# Patient Record
Sex: Female | Born: 1965 | Race: White | Hispanic: No | Marital: Married | Smoking: Never smoker
Health system: Southern US, Community
[De-identification: ages and names within clinical notes are randomized; demographics above are authoritative.]

## PROBLEM LIST (undated history)

## (undated) DIAGNOSIS — I10 Essential (primary) hypertension: Secondary | ICD-10-CM

## (undated) DIAGNOSIS — R519 Headache, unspecified: Secondary | ICD-10-CM

## (undated) DIAGNOSIS — R011 Cardiac murmur, unspecified: Secondary | ICD-10-CM

## (undated) DIAGNOSIS — L301 Dyshidrosis [pompholyx]: Secondary | ICD-10-CM

## (undated) DIAGNOSIS — A64 Unspecified sexually transmitted disease: Secondary | ICD-10-CM

## (undated) HISTORY — DX: Essential (primary) hypertension: I10

## (undated) HISTORY — DX: Pure hypercholesterolemia, unspecified: E78.00

## (undated) HISTORY — PX: HERNIA REPAIR: SHX51

## (undated) HISTORY — DX: Dyshidrosis (pompholyx): L30.1

## (undated) HISTORY — DX: Unspecified sexually transmitted disease: A64

## (undated) HISTORY — DX: Cardiac murmur, unspecified: R01.1

## (undated) HISTORY — DX: Headache, unspecified: R51.9

---
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
## 1998-08-17 ENCOUNTER — Other Ambulatory Visit: Admission: RE | Admit: 1998-08-17 | Discharge: 1998-08-17 | Payer: Self-pay | Admitting: Obstetrics and Gynecology

## 1999-08-31 ENCOUNTER — Other Ambulatory Visit: Admission: RE | Admit: 1999-08-31 | Discharge: 1999-08-31 | Payer: Self-pay | Admitting: *Deleted

## 1999-10-26 ENCOUNTER — Other Ambulatory Visit: Admission: RE | Admit: 1999-10-26 | Discharge: 1999-10-26 | Payer: Self-pay | Admitting: Plastic Surgery

## 2000-04-11 HISTORY — PX: CYST EXCISION: SHX5701

## 2000-09-22 ENCOUNTER — Other Ambulatory Visit: Admission: RE | Admit: 2000-09-22 | Discharge: 2000-09-22 | Payer: Self-pay | Admitting: *Deleted

## 2001-10-25 ENCOUNTER — Other Ambulatory Visit: Admission: RE | Admit: 2001-10-25 | Discharge: 2001-10-25 | Payer: Self-pay | Admitting: Obstetrics and Gynecology

## 2002-09-19 ENCOUNTER — Encounter: Payer: Self-pay | Admitting: Obstetrics and Gynecology

## 2002-09-19 ENCOUNTER — Ambulatory Visit (HOSPITAL_COMMUNITY): Admission: RE | Admit: 2002-09-19 | Discharge: 2002-09-19 | Payer: Self-pay | Admitting: Obstetrics and Gynecology

## 2002-11-04 ENCOUNTER — Other Ambulatory Visit: Admission: RE | Admit: 2002-11-04 | Discharge: 2002-11-04 | Payer: Self-pay | Admitting: Obstetrics and Gynecology

## 2004-01-29 ENCOUNTER — Inpatient Hospital Stay (HOSPITAL_COMMUNITY): Admission: AD | Admit: 2004-01-29 | Discharge: 2004-02-01 | Payer: Self-pay | Admitting: Obstetrics and Gynecology

## 2004-03-03 ENCOUNTER — Other Ambulatory Visit: Admission: RE | Admit: 2004-03-03 | Discharge: 2004-03-03 | Payer: Self-pay | Admitting: Obstetrics and Gynecology

## 2005-07-06 ENCOUNTER — Other Ambulatory Visit: Admission: RE | Admit: 2005-07-06 | Discharge: 2005-07-06 | Payer: Self-pay | Admitting: Obstetrics and Gynecology

## 2007-06-28 ENCOUNTER — Ambulatory Visit: Payer: Self-pay | Admitting: Cardiology

## 2007-09-26 ENCOUNTER — Ambulatory Visit (HOSPITAL_COMMUNITY): Admission: RE | Admit: 2007-09-26 | Discharge: 2007-09-26 | Payer: Self-pay | Admitting: Obstetrics and Gynecology

## 2008-09-26 ENCOUNTER — Ambulatory Visit (HOSPITAL_COMMUNITY): Admission: RE | Admit: 2008-09-26 | Discharge: 2008-09-26 | Payer: Self-pay | Admitting: Obstetrics and Gynecology

## 2008-12-27 DIAGNOSIS — R011 Cardiac murmur, unspecified: Secondary | ICD-10-CM | POA: Insufficient documentation

## 2008-12-27 DIAGNOSIS — A389 Scarlet fever, uncomplicated: Secondary | ICD-10-CM | POA: Insufficient documentation

## 2009-09-28 ENCOUNTER — Ambulatory Visit (HOSPITAL_COMMUNITY): Admission: RE | Admit: 2009-09-28 | Discharge: 2009-09-28 | Payer: Self-pay | Admitting: Obstetrics and Gynecology

## 2010-08-24 NOTE — Assessment & Plan Note (Signed)
[facility]                            CARDIOLOGY OFFICE NOTE   PRESSLEY, BARSKY                      MRN:          [id number]  DATE:06/28/2007                            DOB:          10/24/1965    PRIMARY CARE PHYSICIAN:  Stan Head. Cleta Alberts, M.D.   REASON FOR PRESENTATION:  Evaluate patient with questionable rheumatic  fever.   HISTORY OF PRESENT ILLNESS:  The patient is a pleasant 46 year old white  female without cardiac history.  She developed 2 weeks ago throat and  upper respiratory symptoms.  She was subsequently found to have a  positive strep throat culture.  She was initially treated with a Z-Pak.  However, she then developed pain in multiple joints.  She described back  pain, hip pain, pain in her hands and knees.  This was a multiple  polyarticular arthritis.  She is allergic to PENICILLIN.  Her strep was  treated with doxycycline.  She took this for 7 days.  She was noted to  have a negative stress test following this.  She did have an elevated  ASO titer.  She has not had any rash or skin nodules.  She has not had  any chest discomfort, neck or arm discomfort.  She is not reporting any  palpitations, presyncope or syncope.  However, she is still bothered by  the joint discomfort.  She had been off of the antibiotics for a couple  of days.  She was referred for consideration of any cardiac involvement  with rheumatic fever.  The patient does have a history of a murmur since  childhood.  She apparently has had an echo a few years ago, though I do  not have this report at this point.  She did need cardiovascular follow-  up after the echo some years ago.   PAST MEDICAL HISTORY:  The patient has no history of hypertension,  diabetes or hyperlipidemia.   PAST SURGICAL HISTORY:  1. Kidney surgery as a child.  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  There was      also apparently a  partial nephrectomy.  She does not have any of      the details of this).  2. Hernia repairs as a child, bilateral inguinal.   ALLERGIES:  PENICILLIN causes anaphylaxis.  aspirin caused a rash.  BACTRIM caused a rash.  VANCOMYCIN caused apparent red man syndrome.   MEDICATIONS:  Tylenol.   SOCIAL HISTORY:  The patient is a Runner, broadcasting/film/video in high school.  She is  married.  She has one 80-year-old child.  She does not smoke cigarettes  and never has.  She drinks alcohol socially.   FAMILY HISTORY:  Contributory for her father dying of myocardial  infarction at age 54.  She has a brother with pulmonary hypertension.   REVIEW OF SYSTEMS:  Positive for migraines.  Negative for other systems  except as stated in the HPI.   PHYSICAL EXAMINATION:  The patient is in no distress.  Blood pressure 113/79, heart rate 91 and regular,  body mass index 33,  weight 179 pounds.  HEENT:  Eyelids unremarkable.  Pupils equal, round and reactive to  light.  Fundi within normal limits.  Oral mucosa unremarkable.  NECK:  No jugular venous distention at 45 degrees.  Carotid upstroke  brisk and symmetrical.  No bruits, no thyromegaly.  LYMPHATICS:  No cervical, axillary or inguinal adenopathy.  LUNGS:  Clear to auscultation bilaterally.  BACK:  No costovertebral tenderness.  CHEST:  Unremarkable.  HEART:  PMI not displaced or sustained, S1 and S2 within normal limits,  no S3, no clicks, no rubs, no murmurs.  ABDOMEN:  Flat, positive bowel sounds, normal in frequency and pitch, no  bruits, no rebound, no guarding, no midline pulsatile mass, no  hepatomegaly, no splenomegaly.  SKIN:  No rashes, no nodules.  EXTREMITIES:  2+ pulses, no joint swelling or tenderness, no splinter  hemorrhages, no ulcers, nodes or Janeway lesions.  NEUROLOGIC:  Oriented to person, place and time, cranial nerves II-XII  grossly intact, motor grossly intact.   EKG:  Sinus rhythm, rate 91, axis within normal limits, borderline low   voltages in the limb leads, normal intervals, no acute ST-T wave  changes.   ASSESSMENT/PLAN:  1. Rule out rheumatic fever.  The patient clearly had a      poststreptococcal polyarthritis.  She does not have any Jones      criteria, however, for rheumatic heart disease or acute rheumatic      fever.  At this point she has been treated for the streptococcal      infection.  The joint pain should be treated symptomatically.  She      cannot take ASPIRIN, which is the usual therapy.  I have suggested      Motrin at anti-inflammatory doses.  This could be up to 800 mg four      times a day.  She could take this for a week and perhaps taper      down.  At this point there are no EKG findings or physical findings      of carditis.  Therefore, I do not think further cardiovascular      imaging is warranted.  Prophylaxis for recurrent infections is not      indicated in this condition.  Of note, I did call Dr. Darlina Sicilian to      discuss this case while the patient was in the office, and he      confers with the above.  I did tell her that should she have any      symptoms of chest discomfort, rash, continued arthritis or other      findings, she should present back here or to Dr. Cleta Alberts.  Dr. Cleta Alberts      will be following her closely.  2. Follow-up.  I will see her back as needed.     Rollene Rotunda, MD, [facility]  Electronically Signed    JH/MedQ  DD: 06/28/2007  DT: 06/29/2007  Job #: [id number]   cc:   Brett Canales A. Cleta Alberts, M.D.

## 2010-08-27 NOTE — Discharge Summary (Signed)
Ann West, Ann West               ACCOUNT NO.:  [id number]   MEDICAL RECORD NO.:  [id number]          PATIENT TYPE:  INP   LOCATION:  9143                          FACILITY:  [facility]   PHYSICIAN:  Gerrit Friends. Aldona Bar, M.D.   DATE OF BIRTH:  Nov 08, 1965   DATE OF ADMISSION:  01/29/2004  DATE OF DISCHARGE:  02/01/2004                                 DISCHARGE SUMMARY   DISCHARGE DIAGNOSES:  1.  Term pregnancy, delivered 7 pound 15 ounce female infant, Apgars 9 and 9.  2.  Blood type A positive.   PROCEDURES:  1.  Normal spontaneous delivery.  2.  Second-degree tear and repair.  3.  Manual exploration of uterus.  4.  Epidural anesthesia.   SUMMARY:  This 45 year old, primigravida presented at [redacted] weeks gestation  with contractions.  She had a pregnancy that was complicated by advanced  maternal age and had normal first trimester screening and an alpha-  fetoprotein level but no amniocentesis.  She has a history of mitral valve  prolapse.  On echocardiogram, mild tricuspid regurgitation was noted.  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   After admission, she underwent amniotomy - fluid was slightly blood-tinged.  She was put on clindamycin for SBE prophylaxis.  She progressed, did require  Pitocin, eventually had an epidural placed, and slowly continued to  progress.  She had a second stage of labor that lasted slightly over 3 hours  with a subsequent normal spontaneous delivery of a viable female infant  presenting in a compound presentation.  Apgars were 9 and 9.  After  delivery, there was some uterine atony noted, and uterine exploration was  carried out with no production of products of conception noted.  She was  treated with Methergine postpartum.  Her tear was repaired without  difficulty.  On the morning of October 21, she seemed to be stable but was  continued on clindamycin for a total of 24 hours.  On the morning of October  22, her  hemoglobin was noted to be 10.3 with a white count of 18,200, and a  platelet count of 162,000.  On the morning of October 23, she was ambulating  well, tolerating a regular diet well, having normal bowel and bladder  function and was afebrile.  Her breast feeding was going well, and she was  very desirous of discharge.  Accordingly, she was given all appropriate  instructions and understood all instructions well.   DISCHARGE MEDICATIONS:  1.  Vitamins 1 daily.  2.  Slow release iron 1 daily.  3.  She will use Tylenol for discomfort p.r.n.   She was given a discharge brochure at the time of discharge and understood  all instructions well.  Follow up in the office will be arranged by the  patient 4 weeks from now.   CONDITION ON DISCHARGE:  Improved.      RMW/MEDQ  D:  02/01/2004  T:  02/01/2004  Job:  [id number]

## 2010-11-01 ENCOUNTER — Other Ambulatory Visit (HOSPITAL_COMMUNITY): Payer: Self-pay | Admitting: Obstetrics and Gynecology

## 2010-11-01 DIAGNOSIS — Z1231 Encounter for screening mammogram for malignant neoplasm of breast: Secondary | ICD-10-CM

## 2010-11-10 ENCOUNTER — Ambulatory Visit (HOSPITAL_COMMUNITY)
Admission: RE | Admit: 2010-11-10 | Discharge: 2010-11-10 | Disposition: A | Discharge status: Home / Self Care | Payer: BC Managed Care – PPO | Source: Ambulatory Visit | Attending: Obstetrics and Gynecology | Admitting: Obstetrics and Gynecology

## 2010-11-10 DIAGNOSIS — Z1231 Encounter for screening mammogram for malignant neoplasm of breast: Secondary | ICD-10-CM | POA: Insufficient documentation

## 2011-03-21 ENCOUNTER — Ambulatory Visit (INDEPENDENT_AMBULATORY_CARE_PROVIDER_SITE_OTHER): Payer: BC Managed Care – PPO

## 2011-03-21 DIAGNOSIS — J029 Acute pharyngitis, unspecified: Secondary | ICD-10-CM

## 2011-10-21 ENCOUNTER — Other Ambulatory Visit (HOSPITAL_COMMUNITY): Payer: Self-pay | Admitting: Obstetrics and Gynecology

## 2011-10-21 DIAGNOSIS — Z1231 Encounter for screening mammogram for malignant neoplasm of breast: Secondary | ICD-10-CM

## 2011-11-11 ENCOUNTER — Ambulatory Visit (HOSPITAL_COMMUNITY)
Admission: RE | Admit: 2011-11-11 | Discharge: 2011-11-11 | Disposition: A | Discharge status: Home / Self Care | Payer: BC Managed Care – PPO | Source: Ambulatory Visit | Attending: Obstetrics and Gynecology | Admitting: Obstetrics and Gynecology

## 2011-11-11 DIAGNOSIS — Z1231 Encounter for screening mammogram for malignant neoplasm of breast: Secondary | ICD-10-CM

## 2012-01-17 ENCOUNTER — Ambulatory Visit (INDEPENDENT_AMBULATORY_CARE_PROVIDER_SITE_OTHER): Payer: Worker's Compensation | Admitting: Sports Medicine

## 2012-01-17 VITALS — BP 126/86 | Ht 62.0 in | Wt 188.0 lb

## 2012-01-17 DIAGNOSIS — M722 Plantar fascial fibromatosis: Secondary | ICD-10-CM | POA: Insufficient documentation
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
## 2012-02-16 ENCOUNTER — Ambulatory Visit (INDEPENDENT_AMBULATORY_CARE_PROVIDER_SITE_OTHER): Payer: Worker's Compensation | Admitting: Sports Medicine

## 2012-02-16 VITALS — BP 138/88 | Ht 62.0 in | Wt 188.0 lb

## 2012-02-16 DIAGNOSIS — M79673 Pain in unspecified foot: Secondary | ICD-10-CM

## 2012-02-16 DIAGNOSIS — M79609 Pain in unspecified limb: Secondary | ICD-10-CM

## 2012-02-16 NOTE — Progress Notes (Signed)
Patient ID: Ann West, female   DOB: 1965-11-24, 46 y.o.   MRN: [id number]  SUBJECTIVE: Ann West is a 46 y.o. female who presents for follow-up of LEFT heel pain secondary to plantar fasciitis vs calcaneal contusion.  Since last visit she has been doing ice baths prn on days with significant pain, usually 1-2 / week but not daily.  Otherwise, she has been doing achilles eccentric exercises daily, stretching her achilles / PF daily and wearing insoles 100% of time, in all shoes.  Occasionally wearing arch straps but doesn't see significant difference with them on vs off.  Reports that her first few steps in the morning are most painful or after periods of prolonged sitting.  She has primary complaint of achilles tightness when walking.  She is at 90% of her pre-injury level of function and stability.  PMHx: Mild heart murmur since childhood, she reports as benign   PSHx: 2 abdominal hernia surgeries at 48, 46 years old Ureter-bladder attachment @ 46 years old Axillary cyst resection (benign) @ 46 years old  Meds: None  Allergies: ASA PCN Vancomycin Bactrim / TMP-SMX  Social: Works as Barrister's clerk at [employer] Non-smoker Occasional EtOH with dinner  OBJECTIVE: Vital signs as noted above Well-appearing female in no acute distress, resting comfortably  Foot exam: - No gross abnormalities on inspection of bilateral feet - AROM is symmetric and appropriate for ankle dorsiflexion, plantar flexion, inversion and eversion - 5/5 strength in each of the above movements; all pain-free - No tenderness to palpation at plantar fascia insertion, bilateral malleoli, base of 5th and navicular bilaterally - Negative calcaneal squeeze and tap tests bilaterally - Negative Thompson test bilaterally - No appreciable nodule or defects when palpating achilles tendon bilaterally - No pain, discomfort with compression or stretching of plantar aspect of foot or when maximally  stretching plantar fascia  Foot U/S: - Left plantar fascia has 0.53 cm thickness as measured in longitudinal view (prior measurement 0.56 cm); no appreciable calcifications, hypoechoic changes or edema present.  Negative on Doppler   ASSESSMENT: 1. Calcaneal contusion, healing  PLAN: Given historical and sonographic findings she appears to have calcaneal contusion > plantar fasciitis.  Given her profession, and spending 7 or more hours daily on her feet it would not be unusual to have compensatory hypertrophy of bilateral plantar fascia.  While slightly smaller on ultrasound her plantar fascia still appears enlarged -- however, she is essentially pain-free and almost at pre-injury level of function, thus making calcaneal contusion more likely. She will continue wearing temporary orthotics 100% of the time when prolonged standing (at work) or if she chooses to exercise.  She will use ice baths prn and continue stretching achilles prn for comfort and continued function. We will schedule a tentative followup appointment for 4 weeks from now but if patient is completely asymptomatic she may cancel that appointment to followup prn.   -- Kenney Houseman, MS IV

## 2012-03-19 ENCOUNTER — Encounter: Payer: Worker's Compensation | Admitting: Sports Medicine

## 2012-10-30 ENCOUNTER — Other Ambulatory Visit: Payer: Self-pay | Admitting: Obstetrics and Gynecology

## 2012-10-30 DIAGNOSIS — Z1231 Encounter for screening mammogram for malignant neoplasm of breast: Secondary | ICD-10-CM

## 2012-11-19 ENCOUNTER — Other Ambulatory Visit: Payer: Self-pay | Admitting: Obstetrics and Gynecology

## 2012-11-19 ENCOUNTER — Ambulatory Visit (HOSPITAL_COMMUNITY)
Admission: RE | Admit: 2012-11-19 | Discharge: 2012-11-19 | Disposition: A | Discharge status: Home / Self Care | Payer: BC Managed Care – PPO | Source: Ambulatory Visit | Attending: Obstetrics and Gynecology | Admitting: Obstetrics and Gynecology

## 2012-11-19 DIAGNOSIS — R928 Other abnormal and inconclusive findings on diagnostic imaging of breast: Secondary | ICD-10-CM

## 2012-11-19 DIAGNOSIS — Z1231 Encounter for screening mammogram for malignant neoplasm of breast: Secondary | ICD-10-CM | POA: Insufficient documentation

## 2012-11-22 ENCOUNTER — Telehealth: Payer: Self-pay

## 2012-11-22 NOTE — Telephone Encounter (Signed)
Patient notified of abnormal mammogram and advised to call [facility] to schedule additional views of The right breast.

## 2012-11-22 NOTE — Telephone Encounter (Signed)
[facility] to call for test results (mammogram).

## 2012-11-22 NOTE — Telephone Encounter (Signed)
Message copied by Alphonsa Overall on Thu Nov 22, 2012  1:32 PM ------      Message from: Conley Simmonds      Created: Mon Nov 19, 2012  4:23 PM       Please contact the patient to let her know that I received her mammogram report.  ([facility].)            Ann West is a former patient and does not have an appointment scheduled with me yet at our office. ------

## 2012-11-23 ENCOUNTER — Ambulatory Visit
Admission: RE | Admit: 2012-11-23 | Discharge: 2012-11-23 | Disposition: A | Discharge status: Home / Self Care | Payer: BC Managed Care – PPO | Source: Ambulatory Visit | Attending: Obstetrics and Gynecology | Admitting: Obstetrics and Gynecology

## 2012-11-23 ENCOUNTER — Other Ambulatory Visit: Payer: BC Managed Care – PPO

## 2012-11-23 DIAGNOSIS — R928 Other abnormal and inconclusive findings on diagnostic imaging of breast: Secondary | ICD-10-CM

## 2013-06-13 ENCOUNTER — Emergency Department (INDEPENDENT_AMBULATORY_CARE_PROVIDER_SITE_OTHER)
Admission: EM | Admit: 2013-06-13 | Discharge: 2013-06-13 | Disposition: A | Discharge status: Home / Self Care | Payer: Worker's Compensation | Source: Home / Self Care | Attending: Emergency Medicine | Admitting: Emergency Medicine

## 2013-06-13 ENCOUNTER — Emergency Department (INDEPENDENT_AMBULATORY_CARE_PROVIDER_SITE_OTHER): Payer: Worker's Compensation

## 2013-06-13 ENCOUNTER — Encounter (HOSPITAL_COMMUNITY): Payer: Self-pay | Admitting: Emergency Medicine

## 2013-06-13 DIAGNOSIS — S63611A Unspecified sprain of left index finger, initial encounter: Secondary | ICD-10-CM

## 2013-06-13 DIAGNOSIS — S63613A Unspecified sprain of left middle finger, initial encounter: Secondary | ICD-10-CM

## 2013-06-13 DIAGNOSIS — S6390XA Sprain of unspecified part of unspecified wrist and hand, initial encounter: Secondary | ICD-10-CM

## 2013-06-13 NOTE — ED Notes (Signed)
Reports injury to left index and middle finger at work.   Pt states that she tripped and fell and slid into wall jamming fingers.  States left index was displaced and popped it back in place.  Fingers are bruised and swollen, limited ROM.  Incident happened today around 3:15 p.m

## 2013-06-13 NOTE — ED Provider Notes (Signed)
CSN: [id number]     Arrival date & time 06/13/13  1700 History   None    Chief Complaint  Patient presents with  . Hand Injury   (Consider location/radiation/quality/duration/timing/severity/associated sxs/prior Treatment) Patient is a 48 y.o. female presenting with hand injury. The history is provided by the patient.  Hand Injury Location:  Finger Time since incident:  3 hours Injury: yes   Mechanism of injury: fall   Mechanism of injury comment:  Tripped and fell and left middle and index fingers struck wall. Fall:    Fall occurred:  Tripped   Height of fall:  From standing   Impact surface:  Wall Finger location:  L index finger and L middle finger Pain details:    Quality:  Aching and throbbing   Radiates to:  Does not radiate   Severity:  Moderate   Onset quality:  Sudden   Timing:  Constant Chronicity:  New Handedness:  Right-handed Dislocation: yes (patient states she reduced left index finger dislocation PTA)   Foreign body present:  No foreign bodies Prior injury to area:  No   History reviewed. No pertinent past medical history. Past Surgical History  Procedure Laterality Date  . Hernia repair    . Kidney surgery    . Cyst excision     History reviewed. No pertinent family history. History  Substance Use Topics  . Smoking status: Never Smoker   . Smokeless tobacco: Not on file  . Alcohol Use: Yes   OB History   Grav Para Term Preterm Abortions TAB SAB Ect Mult Living                 Review of Systems  All other systems reviewed and are negative.    Allergies  Asa; Bactrim; Penicillins; and Vancomycin  Home Medications  No current outpatient prescriptions on file. BP 156/98  Pulse 94  Temp(Src) 98.7 F (37.1 C) (Oral)  Resp 16  SpO2 100%  LMP 05/23/2013 Physical Exam  Nursing note and vitals reviewed. Constitutional: She is oriented to person, place, and time. She appears well-developed and well-nourished. No distress.  HENT:  Head:  Normocephalic and atraumatic.  Neck: Normal range of motion. Neck supple.  Cardiovascular: Normal rate.   Pulmonary/Chest: Effort normal.  Musculoskeletal:       Hands: Neurological: She is alert and oriented to person, place, and time.  Skin: Skin is warm and dry.  Psychiatric: She has a normal mood and affect. Her behavior is normal.    ED Course  Procedures (including critical care time) Labs Review Labs Reviewed - No data to display Imaging Review Dg Hand Complete Left  06/13/2013   CLINICAL DATA:  Injury, left index finger and middle finger.  EXAM: LEFT HAND - COMPLETE 3+ VIEW  COMPARISON:  None available for comparison at time of study interpretation.  FINDINGS: No acute fracture deformity or dislocation. Joint space intact without erosions. No destructive bony lesions. Second, third and to lesser extent fourth digit soft tissue swelling without subcutaneous gas or radiopaque foreign bodies.  IMPRESSION: No acute fracture deformity or dislocation.   Electronically Signed   By: Awilda Metro   On: 06/13/2013 19:00     MDM   1. Sprain of left index finger   2. Sprain of left middle finger    Left index and middle finger sprain: xrays without bony fracture or dislocation and physical exam without deficit. Will splint injured fingers for comfort and refer to [facility]  for further management as this is a Doctor, hospitalWorkman's Compensation case.   Jess BartersJennifer Lee [place], [place] 06/13/13 [address]  Ravonda Brecheen Lee [place], [place] 06/13/13 [phone]

## 2013-06-13 NOTE — ED Provider Notes (Signed)
Medical screening examination/treatment/procedure(s) were performed by non-physician practitioner and as supervising physician I was immediately available for consultation/collaboration.  Junie Engram, M.D.  Nara Paternoster C Desaray Marschner, MD 06/13/13 [id number] 

## 2013-06-13 NOTE — Discharge Instructions (Signed)
Finger Sprain A finger sprain is a tear in one of the strong, fibrous tissues that connect the bones (ligaments) in your finger. The severity of the sprain depends on how much of the ligament is torn. The tear can be either partial or complete. CAUSES  Often, sprains are a result of a fall or accident. If you extend your hands to catch an object or to protect yourself, the force of the impact causes the fibers of your ligament to stretch too much. This excess tension causes the fibers of your ligament to tear. SYMPTOMS  You may have some loss of motion in your finger. Other symptoms include:  Bruising.  Tenderness.  Swelling. DIAGNOSIS  In order to diagnose finger sprain, your caregiver will physically examine your finger or thumb to determine how torn the ligament is. Your caregiver may also suggest an X-ray exam of your finger to make sure no bones are broken. TREATMENT  If your ligament is only partially torn, treatment usually involves keeping the finger in a fixed position (immobilization) for a short period. To do this, your caregiver will apply a bandage, cast, or splint to keep your finger from moving until it heals. For a partially torn ligament, the healing process usually takes 2 to 3 weeks. If your ligament is completely torn, you may need surgery to reconnect the ligament to the bone. After surgery a cast or splint will be applied and will need to stay on your finger or thumb for 4 to 6 weeks while your ligament heals. HOME CARE INSTRUCTIONS  Keep your injured finger elevated, when possible, to decrease swelling.  To ease pain and swelling, apply ice to your joint twice a day, for 2 to 3 days:  Put ice in a plastic bag.  Place a towel between your skin and the bag.  Leave the ice on for 15 minutes.  Only take over-the-counter or prescription medicine for pain as directed by your caregiver.  Do not wear rings on your injured finger.  Do not leave your finger unprotected  until pain and stiffness go away (usually 3 to 4 weeks).  Do not allow your cast or splint to get wet. Cover your cast or splint with a plastic bag when you shower or bathe. Do not swim.  Your caregiver may suggest special exercises for you to do during your recovery to prevent or limit permanent stiffness. SEEK IMMEDIATE MEDICAL CARE IF:  Your cast or splint becomes damaged.  Your pain becomes worse rather than better. MAKE SURE YOU:  Understand these instructions.  Will watch your condition.  Will get help right away if you are not doing well or get worse. Document Released: 05/05/2004 Document Revised: 06/20/2011 Document Reviewed: 11/29/2010 [facility] Patient Information 2014 [employer], [place].   Your xrays were without evidence of bony injury or dislocation. Please keep splint clean, dry and in place until you are [facility]. They will outline your activities and restrictions along with completing your return to work paperwork. Please bring copy of your chart and xrays with you to follow up visit.

## 2015-10-21 ENCOUNTER — Other Ambulatory Visit: Payer: Self-pay | Admitting: Obstetrics and Gynecology

## 2015-10-21 DIAGNOSIS — Z1231 Encounter for screening mammogram for malignant neoplasm of breast: Secondary | ICD-10-CM

## 2015-10-22 ENCOUNTER — Encounter: Payer: Self-pay | Admitting: Obstetrics and Gynecology

## 2015-10-22 ENCOUNTER — Ambulatory Visit (INDEPENDENT_AMBULATORY_CARE_PROVIDER_SITE_OTHER): Payer: BC Managed Care – PPO | Admitting: Obstetrics and Gynecology

## 2015-10-22 VITALS — BP 128/84 | HR 70 | Resp 20 | Ht 62.0 in | Wt 187.0 lb

## 2015-10-22 DIAGNOSIS — Z01419 Encounter for gynecological examination (general) (routine) without abnormal findings: Secondary | ICD-10-CM

## 2015-10-22 LAB — CBC
HCT: 41.9 % (ref 35.0–45.0)
HEMOGLOBIN: 14.2 g/dL (ref 11.7–15.5)
MCH: 30 pg (ref 27.0–33.0)
MCHC: 33.9 g/dL (ref 32.0–36.0)
MCV: 88.4 fL (ref 80.0–100.0)
MPV: 10.9 fL (ref 7.5–12.5)
Platelets: 245 10*3/uL (ref 140–400)
RBC: 4.74 MIL/uL (ref 3.80–5.10)
RDW: 14.3 % (ref 11.0–15.0)
WBC: 5.4 10*3/uL (ref 3.8–10.8)

## 2015-10-22 LAB — TSH: TSH: 1.34 m[IU]/L

## 2015-10-22 NOTE — Patient Instructions (Signed)
Health Maintenance, Female Adopting a healthy lifestyle and getting preventive care can go a long way to promote health and wellness. Talk with your health care provider about what schedule of regular examinations is right for you. This is a good chance for you to check in with your provider about disease prevention and staying healthy. In between checkups, there are plenty of things you can do on your own. Experts have done a lot of research about which lifestyle changes and preventive measures are most likely to keep you healthy. Ask your health care provider for more information. WEIGHT AND DIET  Eat a healthy diet  Be sure to include plenty of vegetables, fruits, low-fat dairy products, and lean protein.  Do not eat a lot of foods high in solid fats, added sugars, or salt.  Get regular exercise. This is one of the most important things you can do for your health.  Most adults should exercise for at least 150 minutes each week. The exercise should increase your heart rate and make you sweat (moderate-intensity exercise).  Most adults should also do strengthening exercises at least twice a week. This is in addition to the moderate-intensity exercise.  Maintain a healthy weight  Body mass index (BMI) is a measurement that can be used to identify possible weight problems. It estimates body fat based on height and weight. Your health care provider can help determine your BMI and help you achieve or maintain a healthy weight.  For females 20 years of age and older:   A BMI below 18.5 is considered underweight.  A BMI of 18.5 to 24.9 is normal.  A BMI of 25 to 29.9 is considered overweight.  A BMI of 30 and above is considered obese.  Watch levels of cholesterol and blood lipids  You should start having your blood tested for lipids and cholesterol at 48 years of age, then have this test every 5 years.  You may need to have your cholesterol levels checked more often if:  Your lipid  or cholesterol levels are high.  You are older than 48 years of age.  You are at high risk for heart disease.  CANCER SCREENING   Lung Cancer  Lung cancer screening is recommended for adults 55-80 years old who are at high risk for lung cancer because of a history of smoking.  A yearly low-dose CT scan of the lungs is recommended for people who:  Currently smoke.  Have quit within the past 15 years.  Have at least a 30-pack-year history of smoking. A pack year is smoking an average of one pack of cigarettes a day for 1 year.  Yearly screening should continue until it has been 15 years since you quit.  Yearly screening should stop if you develop a health problem that would prevent you from having lung cancer treatment.  Breast Cancer  Practice breast self-awareness. This means understanding how your breasts normally appear and feel.  It also means doing regular breast self-exams. Let your health care provider know about any changes, no matter how small.  If you are in your 20s or 30s, you should have a clinical breast exam (CBE) by a health care provider every 1-3 years as part of a regular health exam.  If you are 40 or older, have a CBE every year. Also consider having a breast X-ray (mammogram) every year.  If you have a family history of breast cancer, talk to your health care provider about genetic screening.  If you   are at high risk for breast cancer, talk to your health care provider about having an MRI and a mammogram every year.  Breast cancer gene (BRCA) assessment is recommended for women who have family members with BRCA-related cancers. BRCA-related cancers include:  Breast.  Ovarian.  Tubal.  Peritoneal cancers.  Results of the assessment will determine the need for genetic counseling and BRCA1 and BRCA2 testing. Cervical Cancer Your health care provider may recommend that you be screened regularly for cancer of the pelvic organs (ovaries, uterus, and  vagina). This screening involves a pelvic examination, including checking for microscopic changes to the surface of your cervix (Pap test). You may be encouraged to have this screening done every 3 years, beginning at age 21.  For women ages 30-65, health care providers may recommend pelvic exams and Pap testing every 3 years, or they may recommend the Pap and pelvic exam, combined with testing for human papilloma virus (HPV), every 5 years. Some types of HPV increase your risk of cervical cancer. Testing for HPV may also be done on women of any age with unclear Pap test results.  Other health care providers may not recommend any screening for nonpregnant women who are considered low risk for pelvic cancer and who do not have symptoms. Ask your health care provider if a screening pelvic exam is right for you.  If you have had past treatment for cervical cancer or a condition that could lead to cancer, you need Pap tests and screening for cancer for at least 20 years after your treatment. If Pap tests have been discontinued, your risk factors (such as having a new sexual partner) need to be reassessed to determine if screening should resume. Some women have medical problems that increase the chance of getting cervical cancer. In these cases, your health care provider may recommend more frequent screening and Pap tests. Colorectal Cancer  This type of cancer can be detected and often prevented.  Routine colorectal cancer screening usually begins at 48 years of age and continues through 48 years of age.  Your health care provider may recommend screening at an earlier age if you have risk factors for colon cancer.  Your health care provider may also recommend using home test kits to check for hidden blood in the stool.  A small camera at the end of a tube can be used to examine your colon directly (sigmoidoscopy or colonoscopy). This is done to check for the earliest forms of colorectal  cancer.  Routine screening usually begins at age 50.  Direct examination of the colon should be repeated every 5-10 years through 48 years of age. However, you may need to be screened more often if early forms of precancerous polyps or small growths are found. Skin Cancer  Check your skin from head to toe regularly.  Tell your health care provider about any new moles or changes in moles, especially if there is a change in a mole's shape or color.  Also tell your health care provider if you have a mole that is larger than the size of a pencil eraser.  Always use sunscreen. Apply sunscreen liberally and repeatedly throughout the day.  Protect yourself by wearing long sleeves, pants, a wide-brimmed hat, and sunglasses whenever you are outside. HEART DISEASE, DIABETES, AND HIGH BLOOD PRESSURE   High blood pressure causes heart disease and increases the risk of stroke. High blood pressure is more likely to develop in:  People who have blood pressure in the high end   of the normal range (130-139/85-89 mm Hg).  People who are overweight or obese.  People who are African American.  If you are 38-23 years of age, have your blood pressure checked every 3-5 years. If you are 61 years of age or older, have your blood pressure checked every year. You should have your blood pressure measured twice--once when you are at a hospital or clinic, and once when you are not at a hospital or clinic. Record the average of the two measurements. To check your blood pressure when you are not at a hospital or clinic, you can use:  An automated blood pressure machine at a pharmacy.  A home blood pressure monitor.  If you are between 45 years and 39 years old, ask your health care provider if you should take aspirin to prevent strokes.  Have regular diabetes screenings. This involves taking a blood sample to check your fasting blood sugar level.  If you are at a normal weight and have a low risk for diabetes,  have this test once every three years after 48 years of age.  If you are overweight and have a high risk for diabetes, consider being tested at a younger age or more often. PREVENTING INFECTION  Hepatitis B  If you have a higher risk for hepatitis B, you should be screened for this virus. You are considered at high risk for hepatitis B if:  You were born in a country where hepatitis B is common. Ask your health care provider which countries are considered high risk.  Your parents were born in a high-risk country, and you have not been immunized against hepatitis B (hepatitis B vaccine).  You have HIV or AIDS.  You use needles to inject street drugs.  You live with someone who has hepatitis B.  You have had sex with someone who has hepatitis B.  You get hemodialysis treatment.  You take certain medicines for conditions, including cancer, organ transplantation, and autoimmune conditions. Hepatitis C  Blood testing is recommended for:  Everyone born from [birth year] through 1963.  Anyone with known risk factors for hepatitis C. Sexually transmitted infections (STIs)  You should be screened for sexually transmitted infections (STIs) including gonorrhea and chlamydia if:  You are sexually active and are younger than 48 years of age.  You are older than 48 years of age and your health care provider tells you that you are at risk for this type of infection.  Your sexual activity has changed since you were last screened and you are at an increased risk for chlamydia or gonorrhea. Ask your health care provider if you are at risk.  If you do not have HIV, but are at risk, it may be recommended that you take a prescription medicine daily to prevent HIV infection. This is called pre-exposure prophylaxis (PrEP). You are considered at risk if:  You are sexually active and do not regularly use condoms or know the HIV status of your partner(s).  You take drugs by injection.  You are sexually  active with a partner who has HIV. Talk with your health care provider about whether you are at high risk of being infected with HIV. If you choose to begin PrEP, you should first be tested for HIV. You should then be tested every 3 months for as long as you are taking PrEP.  PREGNANCY   If you are premenopausal and you may become pregnant, ask your health care provider about preconception counseling.  If you may  become pregnant, take 400 to 800 micrograms (mcg) of folic acid every day.  If you want to prevent pregnancy, talk to your health care provider about birth control (contraception). OSTEOPOROSIS AND MENOPAUSE   Osteoporosis is a disease in which the bones lose minerals and strength with aging. This can result in serious bone fractures. Your risk for osteoporosis can be identified using a bone density scan.  If you are 61 years of age or older, or if you are at risk for osteoporosis and fractures, ask your health care provider if you should be screened.  Ask your health care provider whether you should take a calcium or vitamin D supplement to lower your risk for osteoporosis.  Menopause may have certain physical symptoms and risks.  Hormone replacement therapy may reduce some of these symptoms and risks. Talk to your health care provider about whether hormone replacement therapy is right for you.  HOME CARE INSTRUCTIONS   Schedule regular health, dental, and eye exams.  Stay current with your immunizations.   Do not use any tobacco products including cigarettes, chewing tobacco, or electronic cigarettes.  If you are pregnant, do not drink alcohol.  If you are breastfeeding, limit how much and how often you drink alcohol.  Limit alcohol intake to no more than 1 drink per day for nonpregnant women. One drink equals 12 ounces of beer, 5 ounces of wine, or 1 ounces of hard liquor.  Do not use street drugs.  Do not share needles.  Ask your health care provider for help if  you need support or information about quitting drugs.  Tell your health care provider if you often feel depressed.  Tell your health care provider if you have ever been abused or do not feel safe at home.   This information is not intended to replace advice given to you by your health care provider. Make sure you discuss any questions you have with your health care provider.   Document Released: 10/11/2010 Document Revised: 04/18/2014 Document Reviewed: 02/27/2013 Elsevier Interactive Patient Education [employer].

## 2015-10-22 NOTE — Progress Notes (Signed)
Patient ID: Ann West, female   DOB: Mar 27, 1966, 50 y.o.   MRN: [id number] 50 y.o.  15P1 [name] here for annual exam.   Returning patient to my care.   No menstrual problems.   Wants lab work today.   Meals are almost vegetarian.   Son is now 411 yo.   PCP:   Lesle ChrisSteven Daub, MD  Patient's last menstrual period was 10/11/2015 (exact date).     Period Cycle (Days): 30 Period Duration (Days): 7-8 Period Pattern: Regular Menstrual Flow: Moderate Menstrual Control: Tampon Menstrual Control Change Freq (Hours): every 3-4 hrs on heaviest day Dysmenorrhea: None     Sexually active: Yes.   female The current method of family planning is none.   Withdrawal.   Considering vasectomy.  Exercising: No.   Smoker:  no  Health Maintenance: Pap:  2013 normal per patient History of abnormal Pap:  no MMG:  11-19-12 possible mass Rt.Breast;Lt.Br.Neg--Diag.Rt.mmg 11-23-12 Density B/Neg/BiRads1/screening 2862yr:[facility].  Patient has appt. 10-26-15. Colonoscopy:  n/a BMD:   n/a  Result  n/a TDaP:  PCP Gardasil:   N/A HIV: neg in pregnancy.  Hep C: NA Screening Labs:  Hb today: 14.1, Urine today: Trace RBCs.  Asymptomatic.   reports that she has never smoked. She does not have any smokeless tobacco history on file. She reports that she drinks about 1.8 - 3.0 oz of alcohol per week. She reports that she does not use illicit drugs.  Past Medical History  Diagnosis Date  . STD (sexually transmitted disease) 1988    Tx'd for Chlamydia  . Heart murmur     had been evaluated by cardiologist--no premeds needed for surgery/procedures    Past Surgical History  Procedure Laterality Date  . Hernia repair    . 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  . Cyst excision      benign cyst removed from Rt. underarm    Current Outpatient Prescriptions  Medication Sig Dispense Refill  . ibuprofen (ADVIL,MOTRIN) 200 MG tablet Take 200 mg by mouth every 6  (six) hours as needed for headache (Takes 2-4 tablets prn headache).     No current facility-administered medications for this visit.    Family History  Problem Relation Age of Onset  . Heart attack Father     Dec age 48  . Hypertension Father   . Hyperlipidemia Father   . Hypertension Mother   . Thyroid disease Mother   . Hypertension Maternal Grandmother   . Hypertension Maternal Grandfather   . Hypertension Paternal Grandmother   . Hypertension Paternal Grandfather     ROS:  Pertinent items are noted in HPI.  Otherwise, a comprehensive ROS was negative.  Exam:   BP 128/84 mmHg  Pulse 70  Resp 20  Ht 5\' 2"  (1.575 m)  Wt 187 lb (84.823 kg)  BMI 34.19 kg/m2  LMP 10/11/2015 (Exact Date)    General appearance: alert, cooperative and appears stated age Head: Normocephalic, without obvious abnormality, atraumatic Neck: no adenopathy, supple, symmetrical, trachea midline and thyroid normal to inspection and palpation Lungs: clear to auscultation bilaterally Breasts: normal appearance, no masses or tenderness, Inspection negative, No nipple retraction or dimpling, No nipple discharge or bleeding, No axillary or supraclavicular adenopathy, scar under right axilla. Heart: regular rate and rhythm Abdomen: incisions:  Yes.    Suprapubic Pfannensteil like incision , soft, non-tender; no masses, no organomegaly Extremities: extremities normal, atraumatic, no cyanosis or edema Skin:  Skin color, texture, turgor normal. No rashes or lesions Lymph nodes: Cervical, supraclavicular, and axillary nodes normal. No abnormal inguinal nodes palpated Neurologic: Grossly normal  Pelvic: External genitalia:  no lesions              Urethra:  normal appearing urethra with no masses, tenderness or lesions              Bartholins and Skenes: normal                 Vagina: normal appearing vagina with normal color and discharge, no lesions              Cervix: no lesions              Pap taken: Yes.    Bimanual Exam:  Uterus:  normal size, contour, position, consistency, mobility, non-tender              Adnexa: normal adnexa and no mass, fullness, tenderness              Rectal exam: Yes.  .  Confirms.              Anus:  normal sphincter tone, no lesions  Chaperone was present for exam.  Assessment:   Well woman visit with normal exam. FH CAD.  Plan: Yearly mammogram recommended after age 24.   Has appointment. Pap and HR HPV as above. Discussed Calcium, Vitamin D, regular exercise program including cardiovascular and weight bearing exercise. Labs performed.  Yes.  .   See orders.  Routine labs. Prescription medication(s) given.  No..  Take PNV daily. Follow up annually and prn.     After visit summary provided.

## 2015-10-23 ENCOUNTER — Telehealth: Payer: Self-pay

## 2015-10-23 LAB — COMPREHENSIVE METABOLIC PANEL
ALBUMIN: 4.4 g/dL (ref 3.6–5.1)
ALT: 13 U/L (ref 6–29)
AST: 16 U/L (ref 10–35)
Alkaline Phosphatase: 39 U/L (ref 33–115)
BUN: 12 mg/dL (ref 7–25)
CO2: 22 mmol/L (ref 20–31)
Calcium: 9 mg/dL (ref 8.6–10.2)
Chloride: 106 mmol/L (ref 98–110)
Creat: 0.91 mg/dL (ref 0.50–1.10)
Glucose, Bld: 96 mg/dL (ref 65–99)
Potassium: 4.2 mmol/L (ref 3.5–5.3)
SODIUM: 138 mmol/L (ref 135–146)
TOTAL PROTEIN: 6.9 g/dL (ref 6.1–8.1)
Total Bilirubin: 0.5 mg/dL (ref 0.2–1.2)

## 2015-10-23 LAB — LIPID PANEL
Cholesterol: 187 mg/dL (ref 125–200)
HDL: 71 mg/dL (ref >=46)
LDL CALC: 98 mg/dL (ref <130)
TRIGLYCERIDES: 89 mg/dL (ref <150)
Total CHOL/HDL Ratio: 2.6 Ratio (ref <=5.0)
VLDL: 18 mg/dL (ref <30)

## 2015-10-23 LAB — VITAMIN D 25 HYDROXY (VIT D DEFICIENCY, FRACTURES): VIT D 25 HYDROXY: 30 ng/mL (ref 30–100)

## 2015-10-23 NOTE — Telephone Encounter (Signed)
Spoke with patient. Advised of message as seen below from Dr.Silva. She is agreeable and verbalizes understanding.  Routing to provider for final review. Patient agreeable to disposition. Will close encounter.  

## 2015-10-23 NOTE — Telephone Encounter (Signed)
-----   Message from Brook E AmundsPatton Salleson C Silva, MD sent at 10/23/2015  5:48 AM EDT ----- Please inform patient of her normal blood work - cholesterol, CMP, CBC, TSH, vit D.  Pap is pending.   Cc- Claudette LawsAmanda Dixon

## 2015-10-26 ENCOUNTER — Ambulatory Visit
Admission: RE | Admit: 2015-10-26 | Discharge: 2015-10-26 | Disposition: A | Discharge status: Home / Self Care | Payer: BC Managed Care – PPO | Source: Ambulatory Visit | Attending: Obstetrics and Gynecology | Admitting: Obstetrics and Gynecology

## 2015-10-26 DIAGNOSIS — Z1231 Encounter for screening mammogram for malignant neoplasm of breast: Secondary | ICD-10-CM

## 2015-10-26 LAB — IPS PAP TEST WITH HPV

## 2015-10-29 ENCOUNTER — Other Ambulatory Visit: Payer: Self-pay | Admitting: Obstetrics and Gynecology

## 2015-10-29 DIAGNOSIS — R928 Other abnormal and inconclusive findings on diagnostic imaging of breast: Secondary | ICD-10-CM

## 2015-11-03 ENCOUNTER — Ambulatory Visit
Admission: RE | Admit: 2015-11-03 | Discharge: 2015-11-03 | Disposition: A | Discharge status: Home / Self Care | Payer: BC Managed Care – PPO | Source: Ambulatory Visit | Attending: Obstetrics and Gynecology | Admitting: Obstetrics and Gynecology

## 2015-11-03 DIAGNOSIS — R928 Other abnormal and inconclusive findings on diagnostic imaging of breast: Secondary | ICD-10-CM

## 2015-11-21 ENCOUNTER — Ambulatory Visit (INDEPENDENT_AMBULATORY_CARE_PROVIDER_SITE_OTHER): Payer: BC Managed Care – PPO

## 2015-11-21 ENCOUNTER — Encounter: Payer: Self-pay | Admitting: Physician Assistant

## 2015-11-21 ENCOUNTER — Ambulatory Visit (INDEPENDENT_AMBULATORY_CARE_PROVIDER_SITE_OTHER): Payer: BC Managed Care – PPO | Admitting: Physician Assistant

## 2015-11-21 VITALS — BP 142/80 | HR 69 | Temp 98.5°F | Resp 16 | Ht 62.0 in | Wt 189.0 lb

## 2015-11-21 DIAGNOSIS — M25571 Pain in right ankle and joints of right foot: Secondary | ICD-10-CM

## 2015-11-21 NOTE — Patient Instructions (Signed)
     IF you received an x-ray today, you will receive an invoice from [facility]. Please contact [facility] at [phone] with questions or concerns regarding your invoice.   IF you received labwork today, you will receive an invoice from [facility]. Please contact [facility] at [phone] with questions or concerns regarding your invoice.   Our billing staff will not be able to assist you with questions regarding bills from these companies.  You will be contacted with the lab results as soon as they are available. The fastest way to get your results is to activate your My Chart account. Instructions are located on the last page of this paperwork. If you have not heard from us regarding the results in 2 weeks, please contact this office.      

## 2015-11-21 NOTE — Progress Notes (Signed)
11/21/2015 1:41 PM   DOB: 04-Jun-1965 / MRN: [id number]  SUBJECTIVE:  Ann West is a 50 y.o. female presenting for a fall in which she hit her right medial malleolus pain after striking the area on a hard surface during a hard surface.  The fall occurred two days ago and she has noticed some swelling and sharp constant pain about the area and made worse with ambulation.     She is allergic to asa [aspirin]; bactrim [sulfamethoxazole-trimethoprim]; penicillins; and vancomycin.   She  has a past medical history of Heart murmur and STD (sexually transmitted disease) (1988).    She  reports that she has never smoked. She does not have any smokeless tobacco history on file. She reports that she drinks about 1.8 - 3.0 oz of alcohol per week . She reports that she does not use drugs. She  reports that she currently engages in sexual activity and has had female partners. She reports using the following method of birth control/protection: None. The patient  has a past surgical history that includes Hernia repair; Kidney surgery; and Cyst excision.  Her family history includes Heart attack in her father; Hyperlipidemia in her father; Hypertension in her father, maternal grandfather, maternal grandmother, mother, paternal grandfather, and paternal grandmother; Thyroid disease in her mother.  Review of Systems  Constitutional: Negative for chills and fever.  Cardiovascular: Negative for chest pain.  Musculoskeletal: Positive for joint pain. Negative for falls and myalgias.  Skin: Negative for itching and rash.  Neurological: Negative for dizziness and headaches.  Psychiatric/Behavioral: Negative for depression and suicidal ideas.    The problem list and medications were reviewed and updated by myself where necessary and exist elsewhere in the encounter.   OBJECTIVE:  BP (!) 142/80   Pulse 69   Temp 98.5 F (36.9 C) (Oral)   Resp 16   Ht  (1.575 m)   Wt 189 lb (85.7 kg)   LMP 10/11/2015  (Exact Date)   SpO2 97%   BMI 34.57 kg/m   BP Readings from Last 3 Encounters:  11/21/15 (!) 142/80  10/22/15 128/84  06/13/13 156/98     Physical Exam  Constitutional: She is oriented to person, place, and time. She appears well-developed and well-nourished. No distress.  Cardiovascular: Normal rate and regular rhythm.   Pulmonary/Chest: Effort normal and breath sounds normal.  Musculoskeletal: Normal range of motion.  Mild tenderness about right medial malleolus.  Negative for ROM and strength deficit.   Neurological: She is alert and oriented to person, place, and time. No cranial nerve deficit.  Skin: She is not diaphoretic.    No results found for this or any previous visit (from the past 72 hour(s)).  Dg Ankle Complete Right  Result Date: 11/21/2015 CLINICAL DATA:  Slipped and fell on LEFT lower 2 nights ago, medial pain EXAM: RIGHT ANKLE - COMPLETE 3+ VIEW COMPARISON:  None FINDINGS: Osseous mineralization normal. Joint spaces preserved. No fracture, dislocation, or bone destruction. Minimal dorsal soft tissue swelling at midfoot. IMPRESSION: No acute osseous abnormalities. Electronically Signed   By: Ulyses Southward M.D.   On: 11/21/2015 11:13    ASSESSMENT AND PLAN  Ceniya was seen today for ankle pain.  Diagnoses and all orders for this visit:  Ankle pain, right: Negative for fracture. Advised RICE, OTC ibuprofen and/or Tylenol. RTC as needed.  -     DG Ankle Complete Right; Future    The patient is advised to call or return to clinic  if she does not see an improvement in symptoms, or to seek the care of the closest emergency department if she worsens with the above plan.   Deliah BostonMichael Clark, MHS, PA-C [facility] 11/21/2015 1:41 PM

## 2016-10-28 ENCOUNTER — Ambulatory Visit (INDEPENDENT_AMBULATORY_CARE_PROVIDER_SITE_OTHER): Payer: BC Managed Care – PPO | Admitting: Obstetrics and Gynecology

## 2016-10-28 ENCOUNTER — Other Ambulatory Visit: Payer: Self-pay | Admitting: Obstetrics and Gynecology

## 2016-10-28 ENCOUNTER — Encounter: Payer: Self-pay | Admitting: Obstetrics and Gynecology

## 2016-10-28 VITALS — BP 138/86 | HR 76 | Resp 16 | Ht 61.75 in | Wt 192.0 lb

## 2016-10-28 DIAGNOSIS — R21 Rash and other nonspecific skin eruption: Secondary | ICD-10-CM | POA: Diagnosis not present

## 2016-10-28 DIAGNOSIS — Z01419 Encounter for gynecological examination (general) (routine) without abnormal findings: Secondary | ICD-10-CM | POA: Diagnosis not present

## 2016-10-28 NOTE — Progress Notes (Signed)
51 y.o. 121P1001 [name] here for annual exam.    Periods are regular.  No bladder or bowel concerns.   Seen at the [facility] yesterday for sore throat and had a negative test for strep.  Has periodic rash on her hands like small blisters.  Does not know where it is coming from.  No dermatologist.   Wants general labs.  Just did a sailing trip.   PCP: Ann West   Patient's last menstrual period was 10/11/2016.           Sexually active: Yes.    The current method of family planning is none.    Exercising: Yes.    walking.  Starting a new exercise plan. Smoker:  no  Health Maintenance: Pap:  10/22/15 Neg. HR HPV:neg  History of abnormal Pap:  no MMG:  11/03/15 Diagnostic Left. BIRADS1:neg.  She will schedule 3D at [facility]. Colonoscopy: Never BMD: Never TDaP: Current with PCP HIV: Neg with pregnancy.  Hep C: N/A Screening Labs: here    reports that she has never smoked. She has never used smokeless tobacco. She reports that she drinks about 1.8 - 3.0 oz of alcohol per week . She reports that she does not use drugs.  Past Medical History:  Diagnosis Date  . Heart murmur    had been evaluated by cardiologist--no premeds needed for surgery/procedures  . STD (sexually transmitted disease) 1988   Tx'd for Chlamydia    Past Surgical History:  Procedure Laterality Date  . CYST EXCISION     benign cyst removed from Rt. underarm  . HERNIA REPAIR    . 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    Current Outpatient Prescriptions  Medication Sig Dispense Refill  . Calcium Carbonate-Vit D-Min (CALCIUM 1200 PO) Take by mouth daily.    Marland Kitchen. ibuprofen (ADVIL,MOTRIN) 200 MG tablet Take 200 mg by mouth every 6 (six) hours as needed for headache (Takes 2-4 tablets prn headache).     No current facility-administered medications for this visit.     Family History  Problem Relation Age of Onset  . Heart attack Father        Dec  age 49  . Hypertension Father   . Hyperlipidemia Father   . Hypertension Mother   . Thyroid disease Mother   . Hypertension Maternal Grandmother   . Hypertension Maternal Grandfather   . Hypertension Paternal Grandmother   . Hypertension Paternal Grandfather     ROS:  Pertinent items are noted in HPI.  Otherwise, a comprehensive ROS was negative.  Exam:   BP 138/86 (BP Location: Right Arm, Patient Position: Sitting, Cuff Size: Large)   Pulse 76   Resp 16   Ht 5' 1.75" (1.568 m)   Wt 192 lb (87.1 kg)   LMP 10/11/2016   BMI 35.40 kg/m     General appearance: alert, cooperative and appears stated age Head: Normocephalic, without obvious abnormality, atraumatic Neck: no adenopathy, supple, symmetrical, trachea midline and thyroid normal to inspection and palpation Lungs: clear to auscultation bilaterally Breasts: normal appearance, no masses or tenderness, No nipple retraction or dimpling, No nipple discharge or bleeding, No axillary or supraclavicular adenopathy Heart: regular rate and rhythm Abdomen: soft, non-tender; no masses, no organomegaly Extremities: extremities normal, atraumatic, no cyanosis or edema Skin: Skin color, texture, turgor normal. No rashes or lesions Lymph nodes: Cervical, supraclavicular, and axillary nodes normal. No abnormal inguinal nodes palpated Neurologic: Grossly normal  Pelvic:  External genitalia:  no lesions              Urethra:  normal appearing urethra with no masses, tenderness or lesions              Bartholins and Skenes: normal                 Vagina: normal appearing vagina with normal color and discharge, no lesions.  First degree rectocele.              Cervix: no lesions              Pap taken: No. Bimanual Exam:  Uterus:  normal size, contour, position, consistency, mobility, non-tender              Adnexa: no mass, fullness, tenderness              Rectal exam: Yes.  .  Confirms.              Anus:  normal sphincter tone, no  lesions  Chaperone was present for exam.  Assessment:   Well woman visit with normal exam. Elevated BP. Rash.  Periodic. Rectocele. Asymptomatic.   Plan: Mammogram screening discussed. Recommended self breast awareness. Pap and HR HPV as above. Guidelines for Calcium, Vitamin D, regular exercise program including cardiovascular and weight bearing exercise. Will do routine labs. Referral to dermatology.  She will start monitoring her BP at home and will see her PCP if remains elevated. Discussed new colonoscopy guidelines. Discussed rectocele.   Follow up annually and prn.    After visit summary provided.

## 2016-10-28 NOTE — Patient Instructions (Addendum)
EXERCISE AND DIET:  We recommended that you start or continue a regular exercise program for good health. Regular exercise means any activity that makes your heart beat faster and makes you sweat.  We recommend exercising at least 30 minutes per day at least 3 days a week, preferably 4 or 5.  We also recommend a diet low in fat and sugar.  Inactivity, poor dietary choices and obesity can cause diabetes, heart attack, stroke, and kidney damage, among others.    ALCOHOL AND SMOKING:  Women should limit their alcohol intake to no more than 7 drinks/beers/glasses of wine (combined, not each!) per week. Moderation of alcohol intake to this level decreases your risk of breast cancer and liver damage. And of course, no recreational drugs are part of a healthy lifestyle.  And absolutely no smoking or even second hand smoke. Most people know smoking can cause heart and lung diseases, but did you know it also contributes to weakening of your bones? Aging of your skin?  Yellowing of your teeth and nails?  CALCIUM AND VITAMIN D:  Adequate intake of calcium and Vitamin D are recommended.  The recommendations for exact amounts of these supplements seem to change often, but generally speaking 600 mg of calcium (either carbonate or citrate) and 800 units of Vitamin D per day seems prudent. Certain women may benefit from higher intake of Vitamin D.  If you are among these women, your doctor will have told you during your visit.    PAP SMEARS:  Pap smears, to check for cervical cancer or precancers,  have traditionally been done yearly, although recent scientific advances have shown that most women can have pap smears less often.  However, every woman still should have a physical exam from her gynecologist every year. It will include a breast check, inspection of the vulva and vagina to check for abnormal growths or skin changes, a visual exam of the cervix, and then an exam to evaluate the size and shape of the uterus and  ovaries.  And after 49 years of age, a rectal exam is indicated to check for rectal cancers. We will also provide age appropriate advice regarding health maintenance, like when you should have certain vaccines, screening for sexually transmitted diseases, bone density testing, colonoscopy, mammograms, etc.   MAMMOGRAMS:  All women over 40 years old should have a yearly mammogram. Many facilities now offer a "3D" mammogram, which may cost around $50 extra out of pocket. If possible,  we recommend you accept the option to have the 3D mammogram performed.  It both reduces the number of women who will be called back for extra views which then turn out to be normal, and it is better than the routine mammogram at detecting truly abnormal areas.    COLONOSCOPY:  Colonoscopy to screen for colon cancer is recommended for all women at age 50.  We know, you hate the idea of the prep.  We agree, BUT, having colon cancer and not knowing it is worse!!  Colon cancer so often starts as a polyp that can be seen and removed at colonscopy, which can quite literally save your life!  And if your first colonoscopy is normal and you have no family history of colon cancer, most women don't have to have it again for 10 years.  Once every ten years, you can do something that may end up saving your life, right?  We will be happy to help you get it scheduled when you are ready.    Be sure to check your insurance coverage so you understand how much it will cost.  It may be covered as a preventative service at no cost, but you should check your particular policy.      Pelvic Organ Prolapse Pelvic organ prolapse is the stretching, bulging, or dropping of pelvic organs into an abnormal position. It happens when the muscles and tissues that surround and support pelvic structures are stretched or weak. Pelvic organ prolapse can involve:  Vagina (vaginal prolapse).  Uterus (uterine prolapse).  Bladder (cystocele).  Rectum  (rectocele).  Intestines (enterocele).  When organs other than the vagina are involved, they often bulge into the vagina or protrude from the vagina, depending on how severe the prolapse is. What are the causes? Causes of this condition include:  Pregnancy, labor, and childbirth.  Long-lasting (chronic) cough.  Chronic constipation.  Obesity.  Past pelvic surgery.  Aging. During and after menopause, a decreased production of the hormone estrogen can weaken pelvic ligaments and muscles.  Consistently lifting more than 50 lb (23 kg).  Buildup of fluid in the abdomen due to certain diseases and other conditions.  What are the signs or symptoms? Symptoms of this condition include:  Loss of bladder control when you cough, sneeze, strain, and exercise (stress incontinence). This may be worse immediately following childbirth, and it may gradually improve over time.  Feeling pressure in your pelvis or vagina. This pressure may increase when you cough or when you are having a bowel movement.  A bulge that protrudes from the opening of your vagina or against your vaginal wall. If your uterus protrudes through the opening of your vagina and rubs against your clothing, you may also experience soreness, ulcers, infection, pain, and bleeding.  Increased effort to have a bowel movement or urinate.  Pain in your low back.  Pain, discomfort, or disinterest in sexual intercourse.  Repeated bladder infections (urinary tract infections).  Difficulty inserting or inability to insert a tampon or applicator.  In some people, this condition does not cause any symptoms. How is this diagnosed? Your health care provider may perform an internal and external vaginal and rectal exam. During the exam, you may be asked to cough and strain while you are lying down, sitting, and standing up. Your health care provider will determine if other tests are required, such as bladder function tests. How is this  treated? In most cases, this condition needs to be treated only if it produces symptoms. No treatment is guaranteed to correct the prolapse or relieve the symptoms completely. Treatment may include:  Lifestyle changes, such as: ? Avoiding drinking beverages that contain caffeine. ? Increasing your intake of high-fiber foods. This can help to decrease constipation and straining during bowel movements. ? Emptying your bladder at scheduled times (bladder training therapy). This can help to reduce or avoid urinary incontinence. ? Losing weight if you are overweight or obese.  Estrogen. Estrogen may help mild prolapse by increasing the strength and tone of pelvic floor muscles.  Kegel exercises. These may help mild cases of prolapse by strengthening and tightening the muscles of the pelvic floor.  Pessary insertion. A pessary is a soft, flexible device that is placed into your vagina by your health care provider to help support the vaginal walls and keep pelvic organs in place.  Surgery. This is often the only form of treatment for severe prolapse. Different types of surgeries are available.  Follow these instructions at home:  Wear a sanitary pad or absorbent   product if you have urinary incontinence.  Avoid heavy lifting and straining with exercise and work. Do not hold your breath when you perform mild to moderate lifting and exercise activities. Limit your activities as directed by your health care provider.  Take medicines only as directed by your health care provider.  Perform Kegel exercises as directed by your health care provider.  If you have a pessary, take care of it as directed by your health care provider. Contact a health care provider if:  Your symptoms interfere with your daily activities or sex life.  You need medicine to help with the discomfort.  You notice bleeding from the vagina that is not related to your period.  You have a fever.  You have pain or bleeding  when you urinate.  You have bleeding when you have a bowel movement.  You lose urine when you have sex.  You have chronic constipation.  You have a pessary that falls out.  You have vaginal discharge that has a bad smell.  You have low abdominal pain or cramping that is unusual for you. This information is not intended to replace advice given to you by your health care provider. Make sure you discuss any questions you have with your health care provider. Document Released: 10/23/2013 Document Revised: 09/03/2015 Document Reviewed: 06/10/2013 Elsevier Interactive Patient Education  2018 Elsevier Inc.  

## 2016-10-29 LAB — COMPREHENSIVE METABOLIC PANEL
ALBUMIN: 4.2 g/dL (ref 3.5–5.5)
ALK PHOS: 40 IU/L (ref 39–117)
ALT: 9 IU/L (ref 0–32)
AST: 16 IU/L (ref 0–40)
Albumin/Globulin Ratio: 1.6 (ref 1.2–2.2)
BUN/Creatinine Ratio: 15 (ref 9–23)
BUN: 13 mg/dL (ref 6–24)
Bilirubin Total: 0.3 mg/dL (ref 0.0–1.2)
CO2: 22 mmol/L (ref 20–29)
CREATININE: 0.86 mg/dL (ref 0.57–1.00)
Calcium: 9.4 mg/dL (ref 8.7–10.2)
Chloride: 103 mmol/L (ref 96–106)
GFR, EST AFRICAN AMERICAN: 92 mL/min/{1.73_m2} (ref >59)
GFR, EST NON AFRICAN AMERICAN: 80 mL/min/{1.73_m2} (ref >59)
GLOBULIN, TOTAL: 2.7 g/dL (ref 1.5–4.5)
GLUCOSE: 82 mg/dL (ref 65–99)
Potassium: 4.7 mmol/L (ref 3.5–5.2)
SODIUM: 141 mmol/L (ref 134–144)
TOTAL PROTEIN: 6.9 g/dL (ref 6.0–8.5)

## 2016-10-29 LAB — CBC
HEMATOCRIT: 40.2 % (ref 34.0–46.6)
Hemoglobin: 13.5 g/dL (ref 11.1–15.9)
MCH: 30.1 pg (ref 26.6–33.0)
MCHC: 33.6 g/dL (ref 31.5–35.7)
MCV: 90 fL (ref 79–97)
PLATELETS: 230 10*3/uL (ref 150–379)
RBC: 4.48 x10E6/uL (ref 3.77–5.28)
RDW: 13.7 % (ref 12.3–15.4)
WBC: 5.3 10*3/uL (ref 3.4–10.8)

## 2016-10-29 LAB — LIPID PANEL
CHOL/HDL RATIO: 3.1 ratio (ref 0.0–4.4)
Cholesterol, Total: 154 mg/dL (ref 100–199)
HDL: 50 mg/dL (ref >39)
LDL Calculated: 77 mg/dL (ref 0–99)
Triglycerides: 134 mg/dL (ref 0–149)
VLDL Cholesterol Cal: 27 mg/dL (ref 5–40)

## 2016-10-29 LAB — VITAMIN D 25 HYDROXY (VIT D DEFICIENCY, FRACTURES): VIT D 25 HYDROXY: 28.3 ng/mL — AB (ref 30.0–100.0)

## 2016-10-29 LAB — TSH: TSH: 1.29 u[IU]/mL (ref 0.450–4.500)

## 2016-11-08 ENCOUNTER — Other Ambulatory Visit: Payer: Self-pay | Admitting: Obstetrics and Gynecology

## 2016-11-08 DIAGNOSIS — Z1231 Encounter for screening mammogram for malignant neoplasm of breast: Secondary | ICD-10-CM

## 2016-11-14 ENCOUNTER — Ambulatory Visit
Admission: RE | Admit: 2016-11-14 | Discharge: 2016-11-14 | Disposition: A | Discharge status: Home / Self Care | Payer: BC Managed Care – PPO | Source: Ambulatory Visit | Attending: Obstetrics and Gynecology | Admitting: Obstetrics and Gynecology

## 2016-11-14 DIAGNOSIS — Z1231 Encounter for screening mammogram for malignant neoplasm of breast: Secondary | ICD-10-CM

## 2017-02-14 ENCOUNTER — Encounter: Payer: Self-pay | Admitting: Obstetrics and Gynecology

## 2017-11-03 ENCOUNTER — Ambulatory Visit: Payer: BC Managed Care – PPO | Admitting: Obstetrics and Gynecology

## 2017-11-08 ENCOUNTER — Other Ambulatory Visit: Payer: Self-pay | Admitting: Obstetrics and Gynecology

## 2017-11-08 DIAGNOSIS — Z1231 Encounter for screening mammogram for malignant neoplasm of breast: Secondary | ICD-10-CM

## 2017-11-08 NOTE — Progress Notes (Signed)
52 y.o. 141P1001 [name] here for annual exam.    Menses are shorter but coming more frequent now.  Shorter as well.  Starting to have hot flashes, which are mild. Rare night sweats.  Wants to avoid hormone therapy.   Wants labs here.  Wants vit D.   Son starting 8th grade.   PCP:   [facility]  Patient's last menstrual period was 11/02/2017 (exact date).     Period Cycle (Days): 24 Period Duration (Days): 4-5 days Period Pattern: Regular Menstrual Flow: Light, Moderate Menstrual Control: Tampon, Panty liner Menstrual Control Change Freq (Hours): changes every 4-6 hours Dysmenorrhea: None     Sexually active: Yes.    The current method of family planning is none.    Exercising: Yes.    walking Smoker:  no  Health Maintenance: Pap:  10/22/15 Neg. HR HPV:neg  History of abnormal Pap:  no MMG:  11/14/2016 Diagnostic Left. BIRADS1:neg.  Scheduled for this month.  Colonoscopy: Never BMD: Never TDaP: Current with PCP HIV: Neg with pregnancy.  Hep C: N/A Screening Labs: Here. Needs today.   reports that she has never smoked. She has never used smokeless tobacco. She reports that she drinks about 1.8 - 3.0 oz of alcohol per week. She reports that she does not use drugs.  Past Medical History:  Diagnosis Date  . Dyshidrotic dermatitis   . Heart murmur    had been evaluated by cardiologist--no premeds needed for surgery/procedures  . STD (sexually transmitted disease) 1988   Tx'd for Chlamydia    Past Surgical History:  Procedure Laterality Date  . CYST EXCISION     benign cyst removed from Rt. underarm  . HERNIA REPAIR    . 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    Current Outpatient Medications  Medication Sig Dispense Refill  . augmented betamethasone dipropionate (DIPROLENE-AF) 0.05 % ointment Apply topically as needed.    . Calcium Carbonate-Vit D-Min (CALCIUM 1200 PO) Take by mouth daily.    . Cholecalciferol  (VITAMIN D3) 1000 units CAPS Take 1,000 Units by mouth daily.    . Ferrous Sulfate (IRON) 325 (65 Fe) MG TABS Take 325 mg by mouth daily.    Marland Kitchen. ibuprofen (ADVIL,MOTRIN) 200 MG tablet Take 200 mg by mouth every 6 (six) hours as needed for headache (Takes 2-4 tablets prn headache).     No current facility-administered medications for this visit.     Family History  Problem Relation Age of Onset  . Heart attack Father        Dec age 50  . Hypertension Father   . Hyperlipidemia Father   . Hypertension Mother   . Thyroid disease Mother   . Heart attack Brother   . Pulmonary Hypertension Brother   . Hypertension Maternal Grandmother   . Hypertension Maternal Grandfather   . Hypertension Paternal Grandmother   . Hypertension Paternal Grandfather     Review of Systems  Constitutional: Negative.   HENT: Negative.   Eyes: Negative.   Respiratory: Negative.   Cardiovascular: Negative.   Gastrointestinal: Negative.   Endocrine: Negative.   Genitourinary:       Menstrual changes  Musculoskeletal: Negative.   Skin: Negative.   Allergic/Immunologic: Negative.   Neurological: Negative.   Hematological: Negative.   Psychiatric/Behavioral: Negative.     Exam:   BP 128/90 (BP Location: Right Arm, Patient Position: Sitting)   Pulse 64   Ht 5\' 3"  (1.6 m)  Wt 196 lb 9.6 oz (89.2 kg)   LMP 11/02/2017 (Exact Date)   BMI 34.83 kg/m     General appearance: alert, cooperative and appears stated age Head: Normocephalic, without obvious abnormality, atraumatic Neck: no adenopathy, supple, symmetrical, trachea midline and thyroid normal to inspection and palpation Lungs: clear to auscultation bilaterally Breasts: normal appearance, no masses or tenderness, No nipple retraction or dimpling, No nipple discharge or bleeding, No axillary or supraclavicular adenopathy Heart: regular rate and rhythm Abdomen: soft, non-tender; no masses, no organomegaly Extremities: extremities normal, atraumatic,  no cyanosis or edema Skin: Skin color, texture, turgor normal. No rashes or lesions Lymph nodes: Cervical, supraclavicular, and axillary nodes normal. No abnormal inguinal nodes palpated Neurologic: Grossly normal  Pelvic: External genitalia:  no lesions              Urethra:  normal appearing urethra with no masses, tenderness or lesions              Bartholins and Skenes: normal                 Vagina: normal appearing vagina with normal color and discharge, no lesions.  First degree rectocele.               Cervix: no lesions              Pap taken: No. Bimanual Exam:  Uterus:  normal size, contour, position, consistency, mobility, non-tender              Adnexa: no mass, fullness, tenderness              Rectal exam: Yes.  .  Confirms.              Anus:  normal sphincter tone, no lesions  Chaperone was present for exam.  Assessment:   Well woman visit with normal exam. First degree rectocele.  Asymptomatic.   Plan: Mammogram screening scheduled. Recommended self breast awareness. Pap and HR HPV as above. Guidelines for Calcium, Vitamin D, regular exercise program including cardiovascular and weight bearing exercise. Routine labs. We discussed her rectocele and to try to avoid straining.  Tx options of observation, pelvic floor PT, pessary and surgery mentioned.  Referral for colonoscopy. Follow up annually and prn.   After visit summary provided.

## 2017-11-10 ENCOUNTER — Other Ambulatory Visit: Payer: Self-pay

## 2017-11-10 ENCOUNTER — Encounter: Payer: Self-pay | Admitting: Obstetrics and Gynecology

## 2017-11-10 ENCOUNTER — Ambulatory Visit: Payer: BC Managed Care – PPO | Admitting: Obstetrics and Gynecology

## 2017-11-10 VITALS — BP 128/90 | HR 64 | Ht 63.0 in | Wt 196.6 lb

## 2017-11-10 DIAGNOSIS — Z1211 Encounter for screening for malignant neoplasm of colon: Secondary | ICD-10-CM | POA: Diagnosis not present

## 2017-11-10 DIAGNOSIS — R7989 Other specified abnormal findings of blood chemistry: Secondary | ICD-10-CM

## 2017-11-10 DIAGNOSIS — Z01419 Encounter for gynecological examination (general) (routine) without abnormal findings: Secondary | ICD-10-CM | POA: Diagnosis not present

## 2017-11-10 NOTE — Patient Instructions (Signed)

## 2017-11-11 LAB — COMPREHENSIVE METABOLIC PANEL
A/G RATIO: 1.8 (ref 1.2–2.2)
ALK PHOS: 37 IU/L — AB (ref 39–117)
ALT: 10 IU/L (ref 0–32)
AST: 13 IU/L (ref 0–40)
Albumin: 4.6 g/dL (ref 3.5–5.5)
BUN / CREAT RATIO: 10 (ref 9–23)
BUN: 10 mg/dL (ref 6–24)
Bilirubin Total: 0.5 mg/dL (ref 0.0–1.2)
CO2: 25 mmol/L (ref 20–29)
Calcium: 9.6 mg/dL (ref 8.7–10.2)
Chloride: 100 mmol/L (ref 96–106)
Creatinine, Ser: 0.98 mg/dL (ref 0.57–1.00)
GFR calc Af Amer: 78 mL/min/{1.73_m2} (ref >59)
GFR calc non Af Amer: 67 mL/min/{1.73_m2} (ref >59)
GLUCOSE: 92 mg/dL (ref 65–99)
Globulin, Total: 2.5 g/dL (ref 1.5–4.5)
POTASSIUM: 4.4 mmol/L (ref 3.5–5.2)
Sodium: 139 mmol/L (ref 134–144)
Total Protein: 7.1 g/dL (ref 6.0–8.5)

## 2017-11-11 LAB — LIPID PANEL
CHOL/HDL RATIO: 3 ratio (ref 0.0–4.4)
CHOLESTEROL TOTAL: 187 mg/dL (ref 100–199)
HDL: 62 mg/dL (ref >39)
LDL Calculated: 110 mg/dL — ABNORMAL HIGH (ref 0–99)
TRIGLYCERIDES: 77 mg/dL (ref 0–149)
VLDL Cholesterol Cal: 15 mg/dL (ref 5–40)

## 2017-11-11 LAB — CBC
Hematocrit: 39.9 % (ref 34.0–46.6)
Hemoglobin: 13.3 g/dL (ref 11.1–15.9)
MCH: 30 pg (ref 26.6–33.0)
MCHC: 33.3 g/dL (ref 31.5–35.7)
MCV: 90 fL (ref 79–97)
PLATELETS: 286 10*3/uL (ref 150–450)
RBC: 4.44 x10E6/uL (ref 3.77–5.28)
RDW: 14.1 % (ref 12.3–15.4)
WBC: 6.3 10*3/uL (ref 3.4–10.8)

## 2017-11-11 LAB — VITAMIN D 25 HYDROXY (VIT D DEFICIENCY, FRACTURES): VIT D 25 HYDROXY: 41.9 ng/mL (ref 30.0–100.0)

## 2017-11-13 ENCOUNTER — Encounter: Payer: Self-pay | Admitting: Gastroenterology

## 2017-11-28 ENCOUNTER — Encounter: Payer: Self-pay | Admitting: Physician Assistant

## 2017-11-28 ENCOUNTER — Ambulatory Visit: Payer: BC Managed Care – PPO | Admitting: Physician Assistant

## 2017-11-28 ENCOUNTER — Other Ambulatory Visit: Payer: Self-pay

## 2017-11-28 VITALS — BP 157/94 | HR 82 | Temp 99.0°F | Resp 16 | Ht 63.0 in | Wt 196.2 lb

## 2017-11-28 DIAGNOSIS — L0501 Pilonidal cyst with abscess: Secondary | ICD-10-CM | POA: Diagnosis not present

## 2017-11-28 MED ORDER — CLINDAMYCIN HCL 300 MG PO CAPS: 300.0000 mg | ORAL_CAPSULE | Freq: Two times a day (BID) | ORAL | 0 refills | Status: DC

## 2017-11-28 NOTE — Patient Instructions (Addendum)
  Start the antibiotic.  Use a warm compress on the area.  Come back if you are getting worse.    If you have lab work done today you will be contacted with your lab results within the next 2 weeks.  If you have not heard from us then please contact us. The fastest way to get your results is to register for My Chart.   IF you received an x-ray today, you will receive an invoice from [facility]. Please contact [facility] at [phone] with questions or concerns regarding your invoice.   IF you received labwork today, you will receive an invoice from [facility]. Please contact [facility] at [phone] with questions or concerns regarding your invoice.   Our billing staff will not be able to assist you with questions regarding bills from these companies.  You will be contacted with the lab results as soon as they are available. The fastest way to get your results is to activate your My Chart account. Instructions are located on the last page of this paperwork. If you have not heard from us regarding the results in 2 weeks, please contact this office.

## 2017-11-28 NOTE — Progress Notes (Signed)
11/28/2017 4:18 PM   DOB: [birth year] / MRN: [id number]  SUBJECTIVE:  Ann West is a 52 y.o. female presenting for painful "spot" at the level of and just right of the gluteal cleft. Symptoms present for about 3 days.  The problem is improving. She has tried to burst the cyst and her husband had some success last night. This is not her first abscess in the area. She is allergic to sulfa and penicillin.  She is allergic to asa [aspirin]; bactrim [sulfamethoxazole-trimethoprim]; penicillins; and vancomycin.   She  has a past medical history of Dyshidrotic dermatitis, Heart murmur, and STD (sexually transmitted disease) (1988).    She  reports that she has never smoked. She has never used smokeless tobacco. She reports that she drinks about 3.0 - 5.0 standard drinks of alcohol per week. She reports that she does not use drugs. She  reports that she currently engages in sexual activity and has had partner(s) who are Female. She reports using the following method of birth control/protection: None. The patient  has a past surgical history that includes Hernia repair; Kidney surgery; and Cyst excision.  Her family history includes Heart attack in her brother and father; Hyperlipidemia in her father; Hypertension in her father, maternal grandfather, maternal grandmother, mother, paternal grandfather, and paternal grandmother; Pulmonary Hypertension in her brother; Thyroid disease in her mother.  Review of Systems  Constitutional: Positive for fever. Negative for diaphoresis and malaise/fatigue.  Gastrointestinal: Negative for abdominal pain, constipation, diarrhea and nausea.  Skin: Positive for rash. Negative for itching.  Neurological: Negative for dizziness.    The problem list and medications were reviewed and updated by myself where necessary and exist elsewhere in the encounter.   OBJECTIVE:  BP (!) 157/94   Pulse 82   Temp 99 F (37.2 C)   Resp 16   Ht 5\' 3"  (1.6 m)   Wt 196 lb  3.2 oz (89 kg)   LMP 11/02/2017 (Exact Date)   SpO2 97%   BMI 34.76 kg/m   Wt Readings from Last 3 Encounters:  11/28/17 196 lb 3.2 oz (89 kg)  11/10/17 196 lb 9.6 oz (89.2 kg)  10/28/16 192 lb (87.1 kg)   Temp Readings from Last 3 Encounters:  11/28/17 99 F (37.2 C)  11/21/15 98.5 F (36.9 C) (Oral)  06/13/13 98.7 F (37.1 C) (Oral)   BP Readings from Last 3 Encounters:  11/28/17 (!) 157/94  11/10/17 128/90  10/28/16 138/86   Pulse Readings from Last 3 Encounters:  11/28/17 82  11/10/17 64  10/28/16 76    Physical Exam  Constitutional: She is oriented to person, place, and time. She appears well-nourished. No distress.  Eyes: Pupils are equal, round, and reactive to light. EOM are normal.  Cardiovascular: Normal rate.  Pulmonary/Chest: Effort normal.  Abdominal: She exhibits no distension.  Neurological: She is alert and oriented to person, place, and time. No cranial nerve deficit. Gait normal.  Skin: Skin is dry. She is not diaphoretic.     Psychiatric: She has a normal mood and affect.  Vitals reviewed.   No results found for: HGBA1C  Lab Results  Component Value Date   WBC 6.3 11/10/2017   HGB 13.3 11/10/2017   HCT 39.9 11/10/2017   MCV 90 11/10/2017   PLT 286 11/10/2017    Lab Results  Component Value Date   CREATININE 0.98 11/10/2017   BUN 10 11/10/2017   NA 139 11/10/2017   K 4.4 11/10/2017  CL 100 11/10/2017   CO2 25 11/10/2017    Lab Results  Component Value Date   ALT 10 11/10/2017   AST 13 11/10/2017   ALKPHOS 37 (L) 11/10/2017   BILITOT 0.5 11/10/2017    Lab Results  Component Value Date   TSH 1.290 10/28/2016    Lab Results  Component Value Date   CHOL 187 11/10/2017   HDL 62 11/10/2017   LDLCALC 110 (H) 11/10/2017   TRIG 77 11/10/2017   CHOLHDL 3.0 11/10/2017     ASSESSMENT AND PLAN:  Ann West was seen today for abcess.  Diagnoses and all orders for this visit:  Pilonidal cyst with abscess: improving.  Will  start medium dose clinda. RTC for drainage if needed.  -     clindamycin (CLEOCIN) 300 MG capsule; Take 1 capsule (300 mg total) by mouth 2 (two) times daily.    The patient is advised to call or return to clinic if she does not see an improvement in symptoms, or to seek the care of the closest emergency department if she worsens with the above plan.   Deliah BostonMichael Richards Pherigo, MHS, PA-C Primary [facility] at [facility] 11/28/2017 4:18 PM

## 2017-11-29 ENCOUNTER — Ambulatory Visit
Admission: RE | Admit: 2017-11-29 | Discharge: 2017-11-29 | Disposition: A | Discharge status: Home / Self Care | Payer: BC Managed Care – PPO | Source: Ambulatory Visit

## 2017-11-29 DIAGNOSIS — Z1231 Encounter for screening mammogram for malignant neoplasm of breast: Secondary | ICD-10-CM

## 2017-12-13 ENCOUNTER — Encounter: Payer: Self-pay | Admitting: Gastroenterology

## 2017-12-13 ENCOUNTER — Ambulatory Visit (AMBULATORY_SURGERY_CENTER): Payer: Self-pay | Admitting: *Deleted

## 2017-12-13 VITALS — Ht 61.5 in | Wt 194.0 lb

## 2017-12-13 DIAGNOSIS — Z1211 Encounter for screening for malignant neoplasm of colon: Secondary | ICD-10-CM

## 2017-12-13 MED ORDER — NA SULFATE-K SULFATE-MG SULF 17.5-3.13-1.6 GM/177ML PO SOLN
ORAL | 0 refills | Status: DC
Start: 2017-12-13 — End: 2017-12-26

## 2017-12-13 NOTE — Progress Notes (Signed)
Patient denies any allergies to eggs or soy. Patient denies any past problems with anesthesia except being very cold, shivering when waking up. Patient denies any oxygen use at home. Patient denies taking any diet/weight loss medications or blood thinners. EMMI education assisgned to patient on colonoscopy, this was explained and instructions given to patient. Suprep coupon given to pt.

## 2017-12-26 ENCOUNTER — Encounter: Payer: Self-pay | Admitting: Gastroenterology

## 2017-12-26 ENCOUNTER — Ambulatory Visit (AMBULATORY_SURGERY_CENTER): Payer: BC Managed Care – PPO | Admitting: Gastroenterology

## 2017-12-26 VITALS — BP 160/103 | HR 82 | Temp 99.5°F | Resp 22

## 2017-12-26 DIAGNOSIS — Z1211 Encounter for screening for malignant neoplasm of colon: Secondary | ICD-10-CM | POA: Diagnosis not present

## 2017-12-26 HISTORY — PX: COLONOSCOPY: SHX174

## 2017-12-26 MED ORDER — SODIUM CHLORIDE 0.9 % IV SOLN: 500.0000 mL | Freq: Once | INTRAVENOUS | Status: DC

## 2017-12-26 NOTE — Patient Instructions (Signed)
Discharge instructions given. Handout on hemorrhoids. Resume previous medications. YOU HAD AN ENDOSCOPIC PROCEDURE TODAY AT THE [facility]:   Refer to the procedure report that was given to you for any specific questions about what was found during the examination.  If the procedure report does not answer your questions, please call your gastroenterologist to clarify.  If you requested that your care partner not be given the details of your procedure findings, then the procedure report has been included in a sealed envelope for you to review at your convenience later.  YOU SHOULD EXPECT: Some feelings of bloating in the abdomen. Passage of more gas than usual.  Walking can help get rid of the air that was put into your GI tract during the procedure and reduce the bloating. If you had a lower endoscopy (such as a colonoscopy or flexible sigmoidoscopy) you may notice spotting of blood in your stool or on the toilet paper. If you underwent a bowel prep for your procedure, you may not have a normal bowel movement for a few days.  Please Note:  You might notice some irritation and congestion in your nose or some drainage.  This is from the oxygen used during your procedure.  There is no need for concern and it should clear up in a day or so.  SYMPTOMS TO REPORT IMMEDIATELY:   Following lower endoscopy (colonoscopy or flexible sigmoidoscopy):  Excessive amounts of blood in the stool  Significant tenderness or worsening of abdominal pains  Swelling of the abdomen that is new, acute  Fever of 100F or higher   For urgent or emergent issues, a gastroenterologist can be reached at any hour by calling [phone].   DIET:  We do recommend a small meal at first, but then you may proceed to your regular diet.  Drink plenty of fluids but you should avoid alcoholic beverages for 24 hours.  ACTIVITY:  You should plan to take it easy for the rest of today and you should NOT DRIVE or use heavy  machinery until tomorrow (because of the sedation medicines used during the test).    FOLLOW UP: Our staff will call the number listed on your records the next business day following your procedure to check on you and address any questions or concerns that you may have regarding the information given to you following your procedure. If we do not reach you, we will leave a message.  However, if you are feeling well and you are not experiencing any problems, there is no need to return our call.  We will assume that you have returned to your regular daily activities without incident.  If any biopsies were taken you will be contacted by phone or by letter within the next 1-3 weeks.  Please call us at [phone] if you have not heard about the biopsies in 3 weeks.    SIGNATURES/CONFIDENTIALITY: You and/or your care partner have signed paperwork which will be entered into your electronic medical record.  These signatures attest to the fact that that the information above on your After Visit Summary has been reviewed and is understood.  Full responsibility of the confidentiality of this discharge information lies with you and/or your care-partner. 

## 2017-12-26 NOTE — Progress Notes (Signed)
Pt's states no medical or surgical changes since previsit or office visit. 

## 2017-12-26 NOTE — Progress Notes (Signed)
Report to PACU, RN, vss, BBS= Clear.  

## 2017-12-26 NOTE — Op Note (Signed)
[facility] Patient Name: Ann West Procedure Date: 12/26/2017 2:24 PM MRN: [id number] Endoscopist: Napoleon Form , MD Age: 52 Referring MD:  Date of Birth: Dec 19, 1965 Gender: Female Account #: [id number] Procedure:                Colonoscopy Indications:              Screening for colorectal malignant neoplasm Medicines:                Monitored Anesthesia Care Procedure:                Pre-Anesthesia Assessment:                           - Prior to the procedure, a History and Physical                            was performed, and patient medications and                            allergies were reviewed. The patient's tolerance of                            previous anesthesia was also reviewed. The risks                            and benefits of the procedure and the sedation                            options and risks were discussed with the patient.                            All questions were answered, and informed consent                            was obtained. Prior Anticoagulants: The patient has                            taken no previous anticoagulant or antiplatelet                            agents. ASA Grade Assessment: II - A patient with                            mild systemic disease. After reviewing the risks                            and benefits, the patient was deemed in                            satisfactory condition to undergo the procedure.                           After obtaining informed consent, the colonoscope  was passed under direct vision. Throughout the                            procedure, the patient's blood pressure, pulse, and                            oxygen saturations were monitored continuously. The                            Model PCF-[id number]) scope was introduced                            through the anus and advanced to the the terminal                            ileum,  with identification of the appendiceal                            orifice and IC valve. The colonoscopy was performed                            without difficulty. The patient tolerated the                            procedure well. The quality of the bowel                            preparation was excellent. The terminal ileum,                            ileocecal valve, appendiceal orifice, and rectum                            were photographed. Scope In: 2:30:57 PM Scope Out: 2:45:21 PM Scope Withdrawal Time: 0 hours 6 minutes 35 seconds  Total Procedure Duration: 0 hours 14 minutes 24 seconds  Findings:                 The perianal and digital rectal examinations were                            normal.                           Non-bleeding internal hemorrhoids were found during                            retroflexion. The hemorrhoids were small.                           The exam was otherwise without abnormality. Complications:            No immediate complications. Estimated Blood Loss:     Estimated blood loss: none. Impression:               - Non-bleeding internal hemorrhoids.                           -  The examination was otherwise normal.                           - No specimens collected. Recommendation:           - Patient has a contact number available for                            emergencies. The signs and symptoms of potential                            delayed complications were discussed with the                            patient. Return to normal activities tomorrow.                            Written discharge instructions were provided to the                            patient.                           - Resume previous diet.                           - Continue present medications.                           - Repeat colonoscopy in 10 years for screening                            purposes. Napoleon Form, MD 12/26/2017 2:48:22 PM This report has  been signed electronically.

## 2017-12-27 ENCOUNTER — Telehealth: Payer: Self-pay

## 2017-12-27 NOTE — Telephone Encounter (Signed)
  Follow up Call-  Call back number 12/26/2017  Post procedure Call Back phone  # [phone]  Permission to leave phone message Yes  Some recent data might be hidden     Patient questions:  Do you have a fever, pain , or abdominal swelling? No. Pain Score  0 *  Have you tolerated food without any problems? Yes.    Have you been able to return to your normal activities? Yes.    Do you have any questions about your discharge instructions: Diet   No. Medications  No. Follow up visit  No.  Do you have questions or concerns about your Care? No.  Actions: * If pain score is 4 or above: No action needed, pain <4.

## 2018-07-15 ENCOUNTER — Telehealth: Payer: BC Managed Care – PPO | Admitting: Family

## 2018-07-15 DIAGNOSIS — Z20822 Contact with and (suspected) exposure to covid-19: Secondary | ICD-10-CM

## 2018-07-15 DIAGNOSIS — R6889 Other general symptoms and signs: Principal | ICD-10-CM

## 2018-07-15 NOTE — Progress Notes (Signed)
E-Visit for Corona Virus Screening  Based on your current symptoms, you may very well have the virus, however your symptoms are mild. Currently, not all patients are being tested. If the symptoms are mild and there is not a known exposure, performing the test is not indicated.  Coronavirus disease 2019 (COVID-19) is a respiratory illness that can spread from person to person. The virus that causes COVID-19 is a new virus that was first identified in the country of [place] but is now found in multiple other countries and has spread to the [place].  Symptoms associated with the virus are mild to severe fever, cough, and shortness of breath. There is currently no vaccine to protect against COVID-19, and there is no specific antiviral treatment for the virus.   To be considered HIGH RISK for Coronavirus (COVID-19), you have to meet the following criteria:  . Traveled to [place], [place], [place], [place] or [place]; or in the [place] to [place], [place], [place], or [place]; and have fever, cough, and shortness of breath within the last 2 weeks of travel OR  . Been in close contact with a person diagnosed with COVID-19 within the last 2 weeks and have fever, cough, and shortness of breath  . IF YOU DO NOT MEET THESE CRITERIA, YOU ARE CONSIDERED LOW RISK FOR COVID-19.   It is vitally important that if you feel that you have an infection such as this virus or any other virus that you stay home and away from places where you may spread it to others.  You should self-quarantine for 14 days if you have symptoms that could potentially be coronavirus and avoid contact with people age 15 and older.   Approximately 5 minutes was spent documenting and reviewing patient's chart.     You may also take acetaminophen (Tylenol) as needed for fever.   Reduce your risk of any infection by using the same precautions used for avoiding the common cold or flu:  Marland Kitchen Wash your hands often with soap and warm  water for at least 20 seconds.  If soap and water are not readily available, use an alcohol-based hand sanitizer with at least 60% alcohol.  . If coughing or sneezing, cover your mouth and nose by coughing or sneezing into the elbow areas of your shirt or coat, into a tissue or into your sleeve (not your hands). . Avoid shaking hands with others and consider head nods or verbal greetings only. . Avoid touching your eyes, nose, or mouth with unwashed hands.  . Avoid close contact with people who are sick. . Avoid places or events with large numbers of people in one location, like concerts or sporting events. . Carefully consider travel plans you have or are making. . If you are planning any travel outside or inside the [place], visit the CDC's Travelers' Health webpage for the latest health notices. . If you have some symptoms but not all symptoms, continue to monitor at home and seek medical attention if your symptoms worsen. . If you are having a medical emergency, call 911.  HOME CARE . Only take medications as instructed by your medical team. . Drink plenty of fluids and get plenty of rest. . A steam or ultrasonic humidifier can help if you have congestion.   GET HELP RIGHT AWAY IF: . You develop worsening fever. . You become short of breath . You cough up blood. . Your symptoms become more severe MAKE SURE YOU   Understand  these instructions.  Will watch your condition.  Will get help right away if you are not doing well or get worse.  Your e-visit answers were reviewed by a board certified advanced clinical practitioner to complete your personal care plan.  Depending on the condition, your plan could have included both over the counter or prescription medications.  If there is a problem please reply once you have received a response from your provider. Your safety is important to [place].  If you have drug allergies check your prescription carefully.    You can use MyChart to ask questions  about today's visit, request a non-urgent call back, or ask for a work or school excuse for 24 hours related to this e-Visit. If it has been greater than 24 hours you will need to follow up with your provider, or enter a new e-Visit to address those concerns. You will get an e-mail in the next two days asking about your experience.  I hope that your e-visit has been valuable and will speed your recovery. Thank you for using e-visits.

## 2018-07-20 ENCOUNTER — Ambulatory Visit: Payer: Self-pay | Admitting: *Deleted

## 2018-07-20 ENCOUNTER — Telehealth: Payer: BC Managed Care – PPO | Admitting: Family

## 2018-07-20 DIAGNOSIS — R05 Cough: Secondary | ICD-10-CM

## 2018-07-20 DIAGNOSIS — R059 Cough, unspecified: Secondary | ICD-10-CM

## 2018-07-20 NOTE — Telephone Encounter (Signed)
Responded via MyChart.

## 2018-07-20 NOTE — Telephone Encounter (Signed)
Pt called back stating that she received a message via my chart that she could schedule a virtual appointment; conference call initiated with Shirron; per Shirron pt offered and accepted appointment with Dr Judee Clara, Ernesto Rutherford, 07/24/2018 at 1020; she verbalized understanding; will route to office for notification.

## 2018-07-20 NOTE — Telephone Encounter (Signed)
Patient calling stating that she had been seen for an evisit on 07/15/18 and was treated for possible COVID 19. Pt states she has been experiencing a low grade fever between 99.4-100.2. Pt states she has been experiencing the fever for approximately 14 days and in the last day or two she developed a runny nose but no other symptoms. Pt wanting to know if something else could be causing the fever since it has lasted this long. Explained to pt that she could do another evisit to follow up on her current symptoms. Advised pt that if she is still experiencing a fever, she would need to still isolate herself at home until fever resolves. Pt verbalized understanding. Pt is a former pt of Daleen Snook. No new pt appt with another provider noted in chart.  Reason for Disposition . COVID -19, questions about . [1] Fever (or feeling feverish) OR cough AND [2] within 14 Days of COVID-19 EXPOSURE (Close Contact)  Protocols used: CORONAVIRUS (COVID-19) EXPOSURE-A-AH

## 2018-07-20 NOTE — Progress Notes (Signed)
This can be normal. 99 degree temps are not consider febrile. I do understand your concern regarding the 100+ fevers. Testing is still reserved for serve patients presenting through the emergency department due to limited supplies. Unfortunately, in our are we do not have the capability to test individuals with concerns who symptoms do not appear life threatening. I know this is frustrating but I want to encourage you to hang in there. I feel this will get better soon

## 2018-07-24 ENCOUNTER — Other Ambulatory Visit: Payer: Self-pay

## 2018-07-24 ENCOUNTER — Telehealth (INDEPENDENT_AMBULATORY_CARE_PROVIDER_SITE_OTHER): Payer: BC Managed Care – PPO | Admitting: Family Medicine

## 2018-07-24 DIAGNOSIS — R509 Fever, unspecified: Secondary | ICD-10-CM | POA: Diagnosis not present

## 2018-07-24 NOTE — Progress Notes (Signed)
Telemedicine Encounter-Established Patient  I discussed the limitations, risks, security and privacy concerns of performing an evaluation and management service by telephone and the availability of in person appointments. I also discussed with the patient that there may be a patient responsible charge related to this service. The patient expressed understanding and agreed to proceed.  This telephone encounter was conducted with the patient's verbal consent via audio telecommunications: yes Patient was instructed to have this encounter in a suitably private space; and to only have persons present to whom they give permission to participate. In addition, patient identity was confirmed by use of name plus two identifiers (DOB and address). Husband was present for phone visit and answered questions about his own health.  I spent a total of 20 min talking with the patient and husband  Spoke with pt about concerns for the Covid-19. Pt states she has been having Fever between 99.4-100.4 for 17 days starting March 28. She states she has not been having any other symptom. She has been quarantined since the fever started.  Pt. Has not traveled outside the country.              Subjective   Ann West is a 53 y.o. female established patient. Telephone visit today for fever  HPI-March 28-flushed-took temp-99.8 continuing on since yesterday 99.4-100.4 for last 17 days. Pt states sinus issues -mild-congestion first thing in the morning-primarily in ears-resolved quickly after being up for awhile. No other symptoms.  No one confirmed exposure. 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  pts last day in classroom-with students 06/22/18, throughout week 3/16-20 in school building-no kids. The following week 3/26 last day in the building. Pt lives with husband, son-14yo.  No health issues ongoing.  Husband states-cough-urge to cough-mild, but can avoid coughing,non productive   intermittently, no fever with in last 3 weeks. Allergies in Spring. Husband feels cough likely allergic.   No symptoms related to son.  Pt with no outside activities-has not left house due to fever.  Sitting in front of computer teaching-HS   Patient Active Problem List   Diagnosis Date Noted  . Plantar fasciitis 01/17/2012  . 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 MURMUR 12/27/2008    Past Medical History:  Diagnosis Date  . Dyshidrotic dermatitis   . Heart murmur    had been evaluated by cardiologist--no premeds needed for surgery/procedures  . STD (sexually transmitted disease) 1988   Tx'd for Chlamydia    Current Outpatient Medications  Medication Sig Dispense Refill  . augmented betamethasone dipropionate (DIPROLENE-AF) 0.05 % ointment Apply topically as needed.    . Calcium Carbonate-Vit D-Min (CALCIUM 1200 PO) Take by mouth daily.    . Cholecalciferol (VITAMIN D3) 1000 units CAPS Take 1,000 Units by mouth daily.    . Ferrous Sulfate (IRON) 325 (65 Fe) MG TABS Take 325 mg by mouth daily.     Marland Kitchen. ibuprofen (ADVIL,MOTRIN) 200 MG tablet Take 200 mg by mouth every 6 (six) hours as needed for headache (Takes 2-4 tablets prn headache).     No current facility-administered medications for this visit.     Allergies  Allergen Reactions  . Penicillins Anaphylaxis  . 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 Asa [Aspirin] Rash  . Bactrim [Sulfamethoxazole-Trimethoprim] Rash    Social History   Socioeconomic History  . Marital status: Married    Spouse name: Not on file  . Number of children: Not on file  .  Years of education: Not on file  . Highest education level: Not on file  Occupational History  . Not on file  Social Needs  . Financial resource strain: Not on file  . Food insecurity:    Worry: Not on file    Inability: Not on file  . Transportation needs:    Medical: Not on file    Non-medical: Not on file  Tobacco Use  . Smoking status: Never Smoker  .  Smokeless tobacco: Never Used  Substance and Sexual Activity  . Alcohol use: Yes    Alcohol/week: 2.0 standard drinks    Types: 2 Standard drinks or equivalent per week  . Drug use: No  . Sexual activity: Yes    Partners: Male    Birth control/protection: None  Lifestyle  . Physical activity:    Days per week: Not on file    Minutes per session: Not on file  . Stress: Not on file  Relationships  . Social connections:    Talks on phone: Not on file    Gets together: Not on file    Attends religious service: Not on file    Active member of club or organization: Not on file    Attends meetings of clubs or organizations: Not on file    Relationship status: Not on file  . Intimate partner violence:    Fear of current or ex partner: Not on file    Emotionally abused: Not on file    Physically abused: Not on file    Forced sexual activity: Not on file  Other Topics Concern  . Not on file  Social History Narrative  . Not on file    ROS CONSTITUTIONAL:  feveralp tenderness EENT:no hearing loss, no sinus problems, nasal congestion, no sore throat, no facial tenderness CV: no chest pain-h/o heart murmur RESP: no SOB, no cough, no wheezing, no sputum production GI: no  diarrhea,  GU: no pain with urination, urinary frequency, urgency,  bladder problems INTEG: no persistent rash, or new skin lesions Objective    Vitals as reported by the patient:temp 99.4-100.4  1. Fever, unspecified No meds I discussed the assessment and treatment plan with the patient. The patient was provided an opportunity to ask questions and all were answered. The patient agreed with the plan and demonstrated an understanding of the instructions.   The patient was advised to call back or seek an in-person evaluation if the symptoms worsen or if the condition fails to improve as anticipated.  I provided 20 minutes of non-face-to-face time during this encounter.  LISA Mat Carne, MD  Primary Care at  [facility] @DATE @

## 2018-07-24 NOTE — Progress Notes (Signed)
Spoke with pt about concerns for the Covid-19. Pt states she has been having Fever between 99.4-100.4 for 17 days starting March 28. She states she has not been having any other symptom. She has been quarantined since the fever started.  Pt. Has not traveled outside the country.

## 2018-07-24 NOTE — Patient Instructions (Signed)
Continue to isolate for 7 days with normal temperature and no additional symptoms. Avoid exposure to others and limit exposure to family.  Continue self care as discussed

## 2018-11-08 ENCOUNTER — Other Ambulatory Visit: Payer: Self-pay

## 2018-11-08 NOTE — Progress Notes (Signed)
53 y.o. [id number] [name] here for annual exam.    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  She wants to do a pap and is willing to pay for it.   Interested in shingles vaccine.   Wants regular blood work.  Asking to test her thyroid due to FH thyroid disease.   PCP:  Deliah BostonMichael Clark, PA-C   Patient's last menstrual period was 10/30/2018 (exact date).           Sexually active: Yes.    The current method of family planning is none.    Exercising: Yes.    walking 1 mile every other day.  Landscaping.  Smoker:  no  Health Maintenance: Pap: 10-22-15 Neg:Neg HR HPV History of abnormal Pap:  no MMG: 11-29-17 Neg/density C/Bi-Rads1--it is scheduled. Colonoscopy:  2019 normal;next 10 year BMD:   n/a  Result  n/a TDaP:  Unsure Gardasil:   N/A HIV:no Hep C: no Screening Labs:  Today.    reports that she has never smoked. She has never used smokeless tobacco. She reports current alcohol use of about 4.0 standard drinks of alcohol per week. She reports that she does not use drugs.  Past Medical History:  Diagnosis Date  . Dyshidrotic dermatitis   . Heart murmur    had been evaluated by cardiologist--no premeds needed for surgery/procedures  . STD (sexually transmitted disease) 1988   Tx'd for Chlamydia    Past Surgical History:  Procedure Laterality Date  . CYST EXCISION     benign cyst removed from Rt. underarm  . HERNIA REPAIR    . 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    Current Outpatient Medications  Medication Sig Dispense Refill  . Potassium 99 MG TABS 1 tablet every other day.    . augmented betamethasone dipropionate (DIPROLENE-AF) 0.05 % ointment Apply topically as needed.    . Calcium Carbonate-Vit D-Min (CALCIUM 1200 PO) Take by mouth daily.    . Cholecalciferol (VITAMIN D3) 1000 units CAPS Take 1,000 Units by mouth daily.    . Ferrous Sulfate (IRON) 325 (65 Fe) MG TABS Take 325 mg by mouth  daily.     Marland Kitchen. ibuprofen (ADVIL,MOTRIN) 200 MG tablet Take 200 mg by mouth every 6 (six) hours as needed for headache (Takes 2-4 tablets prn headache).     No current facility-administered medications for this visit.     Family History  Problem Relation Age of Onset  . Heart attack Father        Dec age 51  . Hypertension Father   . Hyperlipidemia Father   . Hypertension Mother   . Thyroid disease Mother   . Heart attack Brother   . Pulmonary Hypertension Brother   . Hypertension Maternal Grandmother   . Hypertension Maternal Grandfather   . Hypertension Paternal Grandmother   . Hypertension Paternal Grandfather   . Colon cancer Neg Hx   . Esophageal cancer Neg Hx   . Rectal cancer Neg Hx   . Stomach cancer Neg Hx   . Colon polyps Neg Hx     Review of Systems  All other systems reviewed and are negative.   Exam:   BP (!) 142/82 (BP Location: Right Arm, Patient Position: Sitting, Cuff Size: Large)   Pulse 76   Temp 97.8 F (36.6 C)   Ht 5\' 2"  (1.575 m)   Wt 199 lb (90.3 kg)   LMP 10/30/2018 (Exact Date)  BMI 36.40 kg/m     General appearance: alert, cooperative and appears stated age Head: normocephalic, without obvious abnormality, atraumatic Neck: no adenopathy, supple, symmetrical, trachea midline and thyroid normal to inspection and palpation Lungs: clear to auscultation bilaterally Breasts: normal appearance, no masses or tenderness, No nipple retraction or dimpling, No nipple discharge or bleeding, No axillary adenopathy Heart: regular rate and rhythm Abdomen: soft, non-tender; no masses, no organomegaly Extremities: extremities normal, atraumatic, no cyanosis or edema Skin: skin color, texture, turgor normal. No rashes or lesions Lymph nodes: cervical, supraclavicular, and axillary nodes normal. Neurologic: grossly normal  Pelvic: External genitalia:  no lesions              No abnormal inguinal nodes palpated.              Urethra:  normal appearing  urethra with no masses, tenderness or lesions              Bartholins and Skenes: normal                 Vagina: normal appearing vagina with normal color and discharge, no lesions              Cervix: no lesions              Pap taken: Yes.   Bimanual Exam:  Uterus:  normal size, contour, position, consistency, mobility, non-tender              Adnexa: no mass, fullness, tenderness              Rectal exam: Yes.  .  Confirms.              Anus:  normal sphincter tone, no lesions  Chaperone was present for exam.  Assessment:   Well woman visit with normal exam. First degree rectocele.  Asymptomatic.   Plan: Mammogram screening discussed. Self breast awareness reviewed. Pap and HR HPV as above. Guidelines for Calcium, Vitamin D, regular exercise program including cardiovascular and weight bearing exercise. TDap.  Shingrix vaccine.  Routine labs.  Follow up annually and prn.   After visit summary provided.

## 2018-11-12 ENCOUNTER — Ambulatory Visit: Payer: BC Managed Care – PPO | Admitting: Obstetrics and Gynecology

## 2018-11-12 ENCOUNTER — Other Ambulatory Visit (HOSPITAL_COMMUNITY)
Admission: RE | Admit: 2018-11-12 | Discharge: 2018-11-12 | Disposition: A | Discharge status: Home / Self Care | Payer: BC Managed Care – PPO | Source: Ambulatory Visit | Attending: Obstetrics and Gynecology | Admitting: Obstetrics and Gynecology

## 2018-11-12 ENCOUNTER — Encounter: Payer: Self-pay | Admitting: Obstetrics and Gynecology

## 2018-11-12 ENCOUNTER — Other Ambulatory Visit: Payer: Self-pay

## 2018-11-12 VITALS — BP 142/82 | HR 76 | Temp 97.8°F | Ht 62.0 in | Wt 199.0 lb

## 2018-11-12 DIAGNOSIS — Z23 Encounter for immunization: Secondary | ICD-10-CM | POA: Diagnosis not present

## 2018-11-12 DIAGNOSIS — Z01419 Encounter for gynecological examination (general) (routine) without abnormal findings: Secondary | ICD-10-CM

## 2018-11-12 MED ORDER — SHINGRIX 50 MCG/0.5ML IM SUSR: 0.5000 mL | Freq: Once | INTRAMUSCULAR | 1 refills | Status: AC

## 2018-11-12 NOTE — Patient Instructions (Signed)

## 2018-11-13 LAB — CBC
Hematocrit: 38.5 % (ref 34.0–46.6)
Hemoglobin: 12.3 g/dL (ref 11.1–15.9)
MCH: 29.8 pg (ref 26.6–33.0)
MCHC: 31.9 g/dL (ref 31.5–35.7)
MCV: 93 fL (ref 79–97)
Platelets: 256 10*3/uL (ref 150–450)
RBC: 4.13 x10E6/uL (ref 3.77–5.28)
RDW: 13.2 % (ref 11.7–15.4)
WBC: 5.8 10*3/uL (ref 3.4–10.8)

## 2018-11-13 LAB — COMPREHENSIVE METABOLIC PANEL
ALT: 16 IU/L (ref 0–32)
AST: 15 IU/L (ref 0–40)
Albumin/Globulin Ratio: 2.1 (ref 1.2–2.2)
Albumin: 4.4 g/dL (ref 3.8–4.9)
Alkaline Phosphatase: 34 IU/L — ABNORMAL LOW (ref 39–117)
BUN/Creatinine Ratio: 13 (ref 9–23)
BUN: 12 mg/dL (ref 6–24)
Bilirubin Total: 0.3 mg/dL (ref 0.0–1.2)
CO2: 20 mmol/L (ref 20–29)
Calcium: 9.4 mg/dL (ref 8.7–10.2)
Chloride: 105 mmol/L (ref 96–106)
Creatinine, Ser: 0.9 mg/dL (ref 0.57–1.00)
GFR calc Af Amer: 86 mL/min/{1.73_m2} (ref >59)
GFR calc non Af Amer: 74 mL/min/{1.73_m2} (ref >59)
Globulin, Total: 2.1 g/dL (ref 1.5–4.5)
Glucose: 102 mg/dL — ABNORMAL HIGH (ref 65–99)
Potassium: 4.2 mmol/L (ref 3.5–5.2)
Sodium: 139 mmol/L (ref 134–144)
Total Protein: 6.5 g/dL (ref 6.0–8.5)

## 2018-11-13 LAB — TSH: TSH: 1.35 u[IU]/mL (ref 0.450–4.500)

## 2018-11-13 LAB — LIPID PANEL
Chol/HDL Ratio: 2.9 ratio (ref 0.0–4.4)
Cholesterol, Total: 173 mg/dL (ref 100–199)
HDL: 59 mg/dL (ref >39)
LDL Calculated: 91 mg/dL (ref 0–99)
Triglycerides: 116 mg/dL (ref 0–149)
VLDL Cholesterol Cal: 23 mg/dL (ref 5–40)

## 2018-11-13 LAB — CYTOLOGY - PAP
Diagnosis: NEGATIVE
HPV: NOT DETECTED

## 2018-11-20 ENCOUNTER — Other Ambulatory Visit: Payer: Self-pay | Admitting: Obstetrics and Gynecology

## 2018-11-20 DIAGNOSIS — Z1231 Encounter for screening mammogram for malignant neoplasm of breast: Secondary | ICD-10-CM

## 2018-11-27 ENCOUNTER — Encounter: Payer: Self-pay | Admitting: Physician Assistant

## 2018-11-29 ENCOUNTER — Other Ambulatory Visit: Payer: Self-pay

## 2018-11-29 ENCOUNTER — Telehealth (INDEPENDENT_AMBULATORY_CARE_PROVIDER_SITE_OTHER): Payer: BC Managed Care – PPO | Admitting: Family Medicine

## 2018-11-29 DIAGNOSIS — Z20828 Contact with and (suspected) exposure to other viral communicable diseases: Secondary | ICD-10-CM | POA: Diagnosis not present

## 2018-11-29 DIAGNOSIS — Z20822 Contact with and (suspected) exposure to covid-19: Secondary | ICD-10-CM

## 2018-11-29 NOTE — Patient Instructions (Signed)
Advise going to get COVID-19 testing done at the [facility].  Order was placed  If symptoms develop, continue to quarantine and self treat at home unless getting worse at which of it go see a physician probably at the [facility] or 1 of the other emergency room's.  Contact us if if further problems.

## 2018-11-29 NOTE — Progress Notes (Signed)
Patient ID: Ann West, female    DOB: Jun 25, 1965  Age: 53 y.o. MRN: [id number]  No chief complaint on file.   Subjective:   Patient is calling in regarding COVID exposure.  This is a telemedicine call.  Patient understands lack of ability to do examination on the telephone.  Also she understands privacy concerns and charge for the visit.  She is a Pharmacist, hospital.  She found out a couple of days ago that a coworker tested positive.  She was in the room with him 8 days ago for a time with wearing masks and socially distancing.  She is asymptomatic.  She has been taking her temperature for the last 3 days and the range is been from 98 1-1 time it was 99 the rest of the times lower.  She has not been ill.  She has had no headache or dizziness.  No respiratory symptoms.  No breathing difficulties.  No diarrhea.  No body aches or headaches.  She is married has a husband and 39 year old son.  She has been self quarantining at home for the past 3 days.  Current allergies, medications, problem list, past/family and social histories reviewed.  Objective:  LMP 10/30/2018 (Exact Date)   Exam not done but there were no symptoms described.  Assessment & Plan:   Assessment: 1. Exposure to Covid-19 Virus       Plan: Advise getting a test and quarantining until results are back.  Explained what she should do in the event symptoms develop, self treating at home and less getting ill enough to go to an emergency room.  Orders Placed This Encounter  Procedures  . Novel Coronavirus, NAA ([facility])    Order Specific Question:   Is this test for diagnosis or screening    Answer:   Screening    Order Specific Question:   Symptomatic for COVID-19 as defined by CDC    Answer:   No    Order Specific Question:   Hospitalized for COVID-19    Answer:   No    Order Specific Question:   Admitted to ICU for COVID-19    Answer:   No    Order Specific Question:   Previously tested for COVID-19    Answer:   No   Order Specific Question:   Resident in a congregate (group) care setting    Answer:   No    Order Specific Question:   Is the patient student?    Answer:   No    Order Specific Question:   Employed in healthcare setting    Answer:   No    Order Specific Question:   Pregnant    Answer:   No    No orders of the defined types were placed in this encounter.        There are no Patient Instructions on file for this visit.   No follow-ups on file.   Ruben Reason, MD 11/29/2018

## 2018-12-01 LAB — NOVEL CORONAVIRUS, NAA: SARS-CoV-2, NAA: NOT DETECTED

## 2018-12-20 ENCOUNTER — Ambulatory Visit
Admission: RE | Admit: 2018-12-20 | Discharge: 2018-12-20 | Disposition: A | Discharge status: Home / Self Care | Payer: BC Managed Care – PPO | Source: Ambulatory Visit | Attending: Obstetrics and Gynecology | Admitting: Obstetrics and Gynecology

## 2018-12-20 ENCOUNTER — Other Ambulatory Visit: Payer: Self-pay

## 2018-12-20 DIAGNOSIS — Z1231 Encounter for screening mammogram for malignant neoplasm of breast: Secondary | ICD-10-CM

## 2019-06-06 ENCOUNTER — Ambulatory Visit: Payer: BC Managed Care – PPO

## 2019-06-06 ENCOUNTER — Ambulatory Visit: Payer: BC Managed Care – PPO | Attending: Internal Medicine

## 2019-06-06 DIAGNOSIS — Z23 Encounter for immunization: Secondary | ICD-10-CM | POA: Insufficient documentation

## 2019-06-06 NOTE — Progress Notes (Signed)
   Covid-19 Vaccination Clinic  Name:  Ann West    MRN: [id number] DOB: 1966/01/01  06/06/2019  [name] was observed post Covid-19 immunization for 30 minutes based on pre-vaccination screening without incidence. She was provided with Vaccine Information Sheet and instruction to access the V-Safe system.   [name] was instructed to call 911 with any severe reactions post vaccine: Marland Kitchen Difficulty breathing  . Swelling of your face and throat  . A fast heartbeat  . A bad rash all over your body  . Dizziness and weakness    Immunizations Administered    Name Date Dose VIS Date Route   Pfizer COVID-19 Vaccine 06/06/2019  2:18 PM 0.3 mL 03/22/2019 Intramuscular   Manufacturer: [employer], [employer]   Lot: [id number]   NDC: [address]-6

## 2019-07-03 ENCOUNTER — Ambulatory Visit: Payer: BC Managed Care – PPO | Attending: Internal Medicine

## 2019-07-03 DIAGNOSIS — Z23 Encounter for immunization: Secondary | ICD-10-CM

## 2019-07-03 NOTE — Progress Notes (Signed)
   Covid-19 Vaccination Clinic  Name:  Vedika Dumlao    MRN: [id number] DOB: Feb 17, 1966  07/03/2019  Ms. Coppa was observed post Covid-19 immunization for 15 minutes without incident. She was provided with Vaccine Information Sheet and instruction to access the V-Safe system.   Ms. Bevis was instructed to call 911 with any severe reactions post vaccine: Marland Kitchen Difficulty breathing  . Swelling of face and throat  . A fast heartbeat  . A bad rash all over body  . Dizziness and weakness   Immunizations Administered    Name Date Dose VIS Date Route   Pfizer COVID-19 Vaccine 07/03/2019  8:14 AM 0.3 mL 03/22/2019 Intramuscular   Manufacturer: [employer], [employer]   Lot: [id number]   NDC: [address]-8

## 2019-11-12 NOTE — Progress Notes (Signed)
55 y.o. G75P1001 [name] here for annual exam.    Occasional hot flash? She is not sure if this is happening or if the house is just hot.   She had her cycle every 2 weeks around the time of her Covid vaccine and this lasted for 2 months in March or April. Her actual cycles are heavy one day and last a total of 5 days.  Doing early morning walking.   Good bladder control.  Regular bowel movements.   Asking about hep B vaccination.  She is not sure if she had this.   Patient is a Runner, broadcasting/film/video.  PCP:   [facility]  Patient's last menstrual period was 11/04/2019 (exact date).           Sexually active: Yes.    The current method of family planning is none.    Exercising: Yes.    walks 4-5x/week 4-10miles Smoker:  no  Health Maintenance: Pap: 11-12-18 Neg:Neg HR HPV, 10-22-15 Neg:Neg HR HPV History of abnormal Pap:  no MMG: 12-20-18 3D/Neg/density B/BiRads1 Colonoscopy:   2019 normal;next 10 year BMD:   n/a  Result  n/a TDaP:11-12-18  Gardasil:   no HIV:  Donated blood. Hep C:  Donated blood. Screening Labs:  Today.   reports that she has never smoked. She has never used smokeless tobacco. She reports current alcohol use of about 4.0 standard drinks of alcohol per week. She reports that she does not use drugs.  Past Medical History:  Diagnosis Date  . Dyshidrotic dermatitis   . Heart murmur    had been evaluated by cardiologist--no premeds needed for surgery/procedures  . STD (sexually transmitted disease) 1988   Tx'd for Chlamydia    Past Surgical History:  Procedure Laterality Date  . CYST EXCISION     benign cyst removed from Rt. underarm  . HERNIA REPAIR    . 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    Current Outpatient Medications  Medication Sig Dispense Refill  . augmented betamethasone dipropionate (DIPROLENE-AF) 0.05 % ointment Apply topically as needed.    . Calcium Carbonate-Vit D-Min (CALCIUM 1200 PO) Take by  mouth daily.    . Cholecalciferol (VITAMIN D3) 1000 units CAPS Take 1,000 Units by mouth daily.    . Ferrous Sulfate (IRON) 325 (65 Fe) MG TABS Take 325 mg by mouth daily.     Marland Kitchen ibuprofen (ADVIL,MOTRIN) 200 MG tablet Take 200 mg by mouth every 6 (six) hours as needed for headache (Takes 2-4 tablets prn headache).    . Potassium 99 MG TABS 1 tablet every other day.     No current facility-administered medications for this visit.    Family History  Problem Relation Age of Onset  . Heart attack Father        Dec age 68  . Hypertension Father   . Hyperlipidemia Father   . Hypertension Mother   . Thyroid disease Mother   . Heart attack Brother   . Pulmonary Hypertension Brother   . Hypertension Maternal Grandmother   . Hypertension Maternal Grandfather   . Hypertension Paternal Grandmother   . Hypertension Paternal Grandfather   . Colon cancer Neg Hx   . Esophageal cancer Neg Hx   . Rectal cancer Neg Hx   . Stomach cancer Neg Hx   . Colon polyps Neg Hx     Review of Systems  All other systems reviewed and are negative.   Exam:   BP Marland Kitchen)  140/94 (Cuff Size: Large)   Pulse 84   Resp 14   Ht 5\' 2"  (1.575 m)   Wt 197 lb 3.2 oz (89.4 kg)   LMP 11/04/2019 (Exact Date)   BMI 36.07 kg/m     General appearance: alert, cooperative and appears stated age Head: normocephalic, without obvious abnormality, atraumatic Neck: no adenopathy, supple, symmetrical, trachea midline and thyroid normal to inspection and palpation Lungs: clear to auscultation bilaterally Breasts: normal appearance, no masses or tenderness, No nipple retraction or dimpling, No nipple discharge or bleeding, No axillary adenopathy Heart: regular rate and rhythm Abdomen: soft, non-tender; no masses, no organomegaly Extremities: extremities normal, atraumatic, no cyanosis or edema Skin: skin color, texture, turgor normal. No rashes or lesions Lymph nodes: cervical, supraclavicular, and axillary nodes  normal. Neurologic: grossly normal  Pelvic: External genitalia:  no lesions              No abnormal inguinal nodes palpated.              Urethra:  normal appearing urethra with no masses, tenderness or lesions              Bartholins and Skenes: normal                 Vagina: normal appearing vagina with normal color and discharge, no lesions              Cervix: no lesions              Pap taken: No. Bimanual Exam:  Uterus:  normal size, contour, position, consistency, mobility, non-tender              Adnexa: no mass, fullness, tenderness              Rectal exam: Yes.  .  Confirms.  First degree rectocele.               Anus:  normal sphincter tone, no lesions.   Chaperone was present for exam.  Assessment:   Well woman visit with normal exam. First degree rectocele.   Plan: Mammogram screening discussed. Self breast awareness reviewed. Pap and HR HPV as above. Guidelines for Calcium, Vitamin D, regular exercise program including cardiovascular and weight bearing exercise. Routine labs.  She declines hep B aby testing. Follow up annually and prn.   After visit summary provided.

## 2019-11-13 ENCOUNTER — Other Ambulatory Visit: Payer: Self-pay

## 2019-11-13 ENCOUNTER — Ambulatory Visit: Payer: BC Managed Care – PPO | Admitting: Obstetrics and Gynecology

## 2019-11-13 ENCOUNTER — Encounter: Payer: Self-pay | Admitting: Obstetrics and Gynecology

## 2019-11-13 VITALS — BP 140/94 | HR 84 | Resp 14 | Ht 62.0 in | Wt 197.2 lb

## 2019-11-13 DIAGNOSIS — Z01419 Encounter for gynecological examination (general) (routine) without abnormal findings: Secondary | ICD-10-CM | POA: Diagnosis not present

## 2019-11-13 NOTE — Patient Instructions (Signed)

## 2019-11-14 LAB — CBC
Hematocrit: 42.2 % (ref 34.0–46.6)
Hemoglobin: 14.2 g/dL (ref 11.1–15.9)
MCH: 30.3 pg (ref 26.6–33.0)
MCHC: 33.6 g/dL (ref 31.5–35.7)
MCV: 90 fL (ref 79–97)
Platelets: 258 10*3/uL (ref 150–450)
RBC: 4.69 x10E6/uL (ref 3.77–5.28)
RDW: 12.9 % (ref 11.7–15.4)
WBC: 7.5 10*3/uL (ref 3.4–10.8)

## 2019-11-14 LAB — TSH: TSH: 1.08 u[IU]/mL (ref 0.450–4.500)

## 2019-11-14 LAB — COMPREHENSIVE METABOLIC PANEL
ALT: 12 IU/L (ref 0–32)
AST: 16 IU/L (ref 0–40)
Albumin/Globulin Ratio: 1.9 (ref 1.2–2.2)
Albumin: 4.7 g/dL (ref 3.8–4.9)
Alkaline Phosphatase: 43 IU/L — ABNORMAL LOW (ref 48–121)
BUN/Creatinine Ratio: 18 (ref 9–23)
BUN: 16 mg/dL (ref 6–24)
Bilirubin Total: 0.3 mg/dL (ref 0.0–1.2)
CO2: 22 mmol/L (ref 20–29)
Calcium: 9.8 mg/dL (ref 8.7–10.2)
Chloride: 103 mmol/L (ref 96–106)
Creatinine, Ser: 0.91 mg/dL (ref 0.57–1.00)
GFR calc Af Amer: 84 mL/min/{1.73_m2} (ref >59)
GFR calc non Af Amer: 73 mL/min/{1.73_m2} (ref >59)
Globulin, Total: 2.5 g/dL (ref 1.5–4.5)
Glucose: 95 mg/dL (ref 65–99)
Potassium: 4.5 mmol/L (ref 3.5–5.2)
Sodium: 140 mmol/L (ref 134–144)
Total Protein: 7.2 g/dL (ref 6.0–8.5)

## 2019-11-14 LAB — LIPID PANEL
Chol/HDL Ratio: 3.2 ratio (ref 0.0–4.4)
Cholesterol, Total: 199 mg/dL (ref 100–199)
HDL: 63 mg/dL (ref >39)
LDL Chol Calc (NIH): 117 mg/dL — ABNORMAL HIGH (ref 0–99)
Triglycerides: 108 mg/dL (ref 0–149)
VLDL Cholesterol Cal: 19 mg/dL (ref 5–40)

## 2019-11-26 ENCOUNTER — Other Ambulatory Visit: Payer: Self-pay | Admitting: Obstetrics and Gynecology

## 2019-11-26 DIAGNOSIS — Z1231 Encounter for screening mammogram for malignant neoplasm of breast: Secondary | ICD-10-CM

## 2019-12-23 ENCOUNTER — Other Ambulatory Visit: Payer: Self-pay

## 2019-12-23 ENCOUNTER — Ambulatory Visit
Admission: RE | Admit: 2019-12-23 | Discharge: 2019-12-23 | Disposition: A | Discharge status: Home / Self Care | Payer: BC Managed Care – PPO | Source: Ambulatory Visit

## 2019-12-23 DIAGNOSIS — Z1231 Encounter for screening mammogram for malignant neoplasm of breast: Secondary | ICD-10-CM

## 2020-02-22 ENCOUNTER — Ambulatory Visit: Payer: BC Managed Care – PPO | Attending: Internal Medicine

## 2020-02-22 DIAGNOSIS — Z23 Encounter for immunization: Secondary | ICD-10-CM

## 2020-02-22 NOTE — Progress Notes (Signed)
.  covidobs 

## 2020-02-22 NOTE — Progress Notes (Signed)
   Covid-19 Vaccination Clinic  Name:  Gaynelle Pastrana    MRN: [id number] DOB: [birth year]  02/22/2020  Ms. Shelley was observed post Covid-19 immunization for 15 minutes without incident. She was provided with Vaccine Information Sheet and instruction to access the V-Safe system.   Ms. Lovin was instructed to call 911 with any severe reactions post vaccine: Marland Kitchen Difficulty breathing  . Swelling of face and throat  . A fast heartbeat  . A bad rash all over body  . Dizziness and weakness   Immunizations Administered    Name Date Dose VIS Date Route   Pfizer COVID-19 Vaccine 02/22/2020 12:23 PM 0.3 mL 01/29/2020 Intramuscular   Manufacturer: [employer], [employer]   Lot: [id number]   NDC: [address]-0

## 2020-11-12 ENCOUNTER — Other Ambulatory Visit: Payer: Self-pay | Admitting: Obstetrics and Gynecology

## 2020-11-12 DIAGNOSIS — Z1231 Encounter for screening mammogram for malignant neoplasm of breast: Secondary | ICD-10-CM

## 2020-11-17 ENCOUNTER — Other Ambulatory Visit: Payer: Self-pay

## 2020-11-17 ENCOUNTER — Encounter: Payer: Self-pay | Admitting: Obstetrics and Gynecology

## 2020-11-17 ENCOUNTER — Ambulatory Visit (INDEPENDENT_AMBULATORY_CARE_PROVIDER_SITE_OTHER): Payer: BC Managed Care – PPO | Admitting: Obstetrics and Gynecology

## 2020-11-17 VITALS — BP 142/84 | HR 91 | Ht 61.5 in | Wt 198.0 lb

## 2020-11-17 DIAGNOSIS — Z01419 Encounter for gynecological examination (general) (routine) without abnormal findings: Secondary | ICD-10-CM

## 2020-11-17 NOTE — Progress Notes (Signed)
55 y.o. G59P1001 [name] here for annual exam.    Cycles are still regular, every 3 - 5 weeks.  Can last 4 - 5 days.  Occasional hot flash.  Rare night sweats.   Mother may be moving to [place].   PCP:  [facility]  Patient's last menstrual period was 11/09/2020 (exact date).           Sexually active: Yes.    The current method of family planning is none.    Exercising: No.  The patient does not participate in regular exercise at present. Smoker:  no  Health Maintenance: Pap:   11-12-18 Neg:Neg HR HPV, 10-22-15 Neg:Neg HR HPV History of abnormal Pap:  no MMG: 12-23-19 3D/Neg/Birads1.  She has this scheduled.  Colonoscopy:  2019 normal;next 10 year BMD:   n/a  Result  n/a TDaP:  11-12-18 Gardasil:   no WUJ:[id number] blood Hep C:Donated blood Screening Labs:  today   reports that she has never smoked. She has never used smokeless tobacco. She reports current alcohol use of about 4.0 standard drinks of alcohol per week. She reports that she does not use drugs.  Past Medical History:  Diagnosis Date   Dyshidrotic dermatitis    Heart murmur    had been evaluated by cardiologist--no premeds needed for surgery/procedures   STD (sexually transmitted disease) 1988   Tx'd for Chlamydia    Past Surgical History:  Procedure Laterality Date   CYST EXCISION     benign cyst removed from Rt. 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    Current Outpatient Medications  Medication Sig Dispense Refill   Calcium Carbonate-Vit D-Min (CALCIUM 1200 PO) Take by mouth daily.     Cholecalciferol (VITAMIN D3) 1000 units CAPS Take 1,000 Units by mouth daily.     Ferrous Sulfate (IRON) 325 (65 Fe) MG TABS Take 325 mg by mouth daily.      ibuprofen (ADVIL,MOTRIN) 200 MG tablet Take 200 mg by mouth every 6 (six) hours as needed for headache (Takes 2-4 tablets prn headache).     Potassium 99 MG TABS 1 tablet at  bedtime.     No current facility-administered medications for this visit.    Family History  Problem Relation Age of Onset   Heart attack Father        Dec age 96   Hypertension Father    Hyperlipidemia Father    Hypertension Mother    Thyroid disease Mother    Heart attack Brother    Pulmonary Hypertension Brother    Hypertension Maternal Grandmother    Hypertension Maternal Grandfather    Hypertension Paternal Grandmother    Hypertension Paternal Grandfather    Colon cancer Neg Hx    Esophageal cancer Neg Hx    Rectal cancer Neg Hx    Stomach cancer Neg Hx    Colon polyps Neg Hx    Breast cancer Neg Hx     Review of Systems  All other systems reviewed and are negative.  Exam:   BP (!) 142/84   Pulse 91   Ht 5' 1.5" (1.562 m)   Wt 198 lb (89.8 kg)   LMP 11/09/2020 (Exact Date)   SpO2 97%   BMI 36.81 kg/m     General appearance: alert, cooperative and appears stated age Head: normocephalic, without obvious abnormality, atraumatic Neck: no adenopathy, supple, symmetrical, trachea midline and thyroid  normal to inspection and palpation Lungs: clear to auscultation bilaterally Breasts: normal appearance, no masses or tenderness, No nipple retraction or dimpling, No nipple discharge or bleeding, No axillary adenopathy Heart: regular rate and rhythm Abdomen: soft, non-tender; no masses, no organomegaly Extremities: extremities normal, atraumatic, no cyanosis or edema Skin: skin color, texture, turgor normal. No rashes or lesions Lymph nodes: cervical, supraclavicular, and axillary nodes normal. Neurologic: grossly normal  Pelvic: External genitalia:  no lesions              No abnormal inguinal nodes palpated.              Urethra:  normal appearing urethra with no masses, tenderness or lesions              Bartholins and Skenes: normal                 Vagina: normal appearing vagina with normal color and discharge, no lesions              Cervix: no lesions               Pap taken: no Bimanual Exam:  Uterus:  normal size, contour, position, consistency, mobility, non-tender              Adnexa: no mass, fullness, tenderness              Rectal exam: yes..  Confirms.              Anus:  normal sphincter tone, no lesions  Chaperone was present for exam:  Marchelle Folks, CMA  Assessment:   Well woman visit with gynecologic exam. Hx first degree rectocele.  Plan: Mammogram screening discussed. Self breast awareness reviewed. Pap and HR HPV 2025. . Guidelines for Calcium, Vitamin D, regular exercise program including cardiovascular and weight bearing exercise. Routine labs.  Follow up annually and prn.   After visit summary provided.

## 2020-11-17 NOTE — Patient Instructions (Signed)

## 2020-11-18 LAB — LIPID PANEL
Cholesterol: 199 mg/dL (ref <200)
HDL: 72 mg/dL (ref >=50)
LDL Cholesterol (Calc): 110 mg/dL (calc) — ABNORMAL HIGH
Non-HDL Cholesterol (Calc): 127 mg/dL (calc) (ref <130)
Total CHOL/HDL Ratio: 2.8 (calc) (ref <5.0)
Triglycerides: 78 mg/dL (ref <150)

## 2020-11-18 LAB — COMPREHENSIVE METABOLIC PANEL
AG Ratio: 1.7 (calc) (ref 1.0–2.5)
ALT: 11 U/L (ref 6–29)
AST: 14 U/L (ref 10–35)
Albumin: 4.5 g/dL (ref 3.6–5.1)
Alkaline phosphatase (APISO): 36 U/L — ABNORMAL LOW (ref 37–153)
BUN: 13 mg/dL (ref 7–25)
CO2: 26 mmol/L (ref 20–32)
Calcium: 9.5 mg/dL (ref 8.6–10.4)
Chloride: 104 mmol/L (ref 98–110)
Creat: 1.02 mg/dL (ref 0.50–1.03)
Globulin: 2.7 g/dL (calc) (ref 1.9–3.7)
Glucose, Bld: 96 mg/dL (ref 65–99)
Potassium: 4.3 mmol/L (ref 3.5–5.3)
Sodium: 139 mmol/L (ref 135–146)
Total Bilirubin: 0.6 mg/dL (ref 0.2–1.2)
Total Protein: 7.2 g/dL (ref 6.1–8.1)

## 2020-11-18 LAB — CBC
HCT: 42.5 % (ref 35.0–45.0)
Hemoglobin: 13.8 g/dL (ref 11.7–15.5)
MCH: 29.3 pg (ref 27.0–33.0)
MCHC: 32.5 g/dL (ref 32.0–36.0)
MCV: 90.2 fL (ref 80.0–100.0)
MPV: 10.7 fL (ref 7.5–12.5)
Platelets: 271 10*3/uL (ref 140–400)
RBC: 4.71 10*6/uL (ref 3.80–5.10)
RDW: 12.9 % (ref 11.0–15.0)
WBC: 8.2 10*3/uL (ref 3.8–10.8)

## 2020-11-18 LAB — TSH: TSH: 1.3 mIU/L

## 2020-12-13 DIAGNOSIS — U071 COVID-19: Secondary | ICD-10-CM | POA: Insufficient documentation

## 2021-01-04 ENCOUNTER — Other Ambulatory Visit: Payer: Self-pay

## 2021-01-04 ENCOUNTER — Ambulatory Visit
Admission: RE | Admit: 2021-01-04 | Discharge: 2021-01-04 | Disposition: A | Discharge status: Home / Self Care | Payer: BC Managed Care – PPO | Source: Ambulatory Visit | Attending: Obstetrics and Gynecology | Admitting: Obstetrics and Gynecology

## 2021-01-04 DIAGNOSIS — Z1231 Encounter for screening mammogram for malignant neoplasm of breast: Secondary | ICD-10-CM

## 2021-02-11 ENCOUNTER — Telehealth: Payer: Self-pay

## 2021-02-11 NOTE — Telephone Encounter (Signed)
Called to discuss NPP

## 2021-03-10 ENCOUNTER — Encounter: Payer: Self-pay | Admitting: Family Medicine

## 2021-03-10 ENCOUNTER — Ambulatory Visit (INDEPENDENT_AMBULATORY_CARE_PROVIDER_SITE_OTHER): Payer: BC Managed Care – PPO | Admitting: Family Medicine

## 2021-03-10 ENCOUNTER — Other Ambulatory Visit: Payer: Self-pay

## 2021-03-10 VITALS — BP 153/79 | HR 68 | Temp 98.7°F | Ht 61.0 in | Wt 195.0 lb

## 2021-03-10 DIAGNOSIS — I10 Essential (primary) hypertension: Secondary | ICD-10-CM

## 2021-03-10 NOTE — Patient Instructions (Signed)

## 2021-03-10 NOTE — Progress Notes (Signed)
Office Note 03/10/2021  CC:  Chief Complaint  Patient presents with   Establish Care   HPI:  Ann West is a 55 y.o. White female who is here to establish care and discuss elevated blood pressure. Patient's most recent primary MD: Dr. Everlene Farrier at [facility] in [place]. Old records from GYN reviewed prior to or during today's visit.  Patient feels well. She has had some elevated blood pressures over the last couple years but is never been considered something that medication was needed for apparently. Strong family history of hypertension.  Past Medical History:  Diagnosis Date   Dyshidrotic dermatitis    Essential hypertension    Frequent headaches    Heart murmur    Pt states started after having scarlet fever as a child, "comes and goes" since then.  Has been evaluated by cardiologist--no premeds needed for surgery/procedures    Past Surgical History:  Procedure Laterality Date   CYST EXCISION  2002   benign cyst removed from Rt. underarm   HERNIA REPAIR     Bilat, inguinal, 1973 and 74.   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    Family History  Problem Relation Age of Onset   Hypertension Mother    Thyroid disease Mother    Arthritis Mother    Heart attack Father        Dec age 42   Hypertension Father    Hyperlipidemia Father    Early death Father        age 77   Heart attack Brother    Pulmonary Hypertension Brother    Early death Brother    Heart disease Brother    Learning disabilities Brother    Hypertension Maternal Grandmother    Arthritis Maternal Grandmother    Cancer Maternal Grandmother    Hypertension Maternal Grandfather    Alcohol abuse Maternal Grandfather    Heart attack Maternal Grandfather    Arthritis Maternal Grandfather    Hypertension Paternal Grandmother    Cancer Paternal Grandmother    Hearing loss Paternal Grandmother    Arthritis Paternal Grandmother    Hypertension Paternal  Grandfather    Arthritis Paternal Grandfather    COPD Paternal Grandfather    Heart attack Paternal Grandfather    Healthy Son    Colon cancer Neg Hx    Esophageal cancer Neg Hx    Rectal cancer Neg Hx    Stomach cancer Neg Hx    Colon polyps Neg Hx    Breast cancer Neg Hx     Social History   Socioeconomic History   Marital status: Married    Spouse name: Not on file   Number of children: Not on file   Years of education: Not on file   Highest education level: Not on file  Occupational History   Not on file  Tobacco Use   Smoking status: Never   Smokeless tobacco: Never  Vaping Use   Vaping Use: Never used  Substance and Sexual Activity   Alcohol use: Yes    Alcohol/week: 4.0 standard drinks    Types: 4 Standard drinks or equivalent per week   Drug use: No   Sexual activity: Yes    Partners: Male    Birth control/protection: None  Other Topics Concern   Not on file  Social History Narrative   Married, 1 son.   Originally from [place] near [place].   Relocated to [facility] approximately 2005.  Masters in education.   High school Barrister's clerk, [employer].   Occasional alcohol.  No tobacco or drugs.   Social Determinants of Health   Financial Resource Strain: Not on file  Food Insecurity: Not on file  Transportation Needs: Not on file  Physical Activity: Not on file  Stress: Not on file  Social Connections: Not on file  Intimate Partner Violence: Not on file    Outpatient Encounter Medications as of 03/10/2021  Medication Sig   Calcium Carbonate-Vit D-Min (CALCIUM 1200 PO) Take by mouth daily.   Cholecalciferol (VITAMIN D3) 1000 units CAPS Take 1,000 Units by mouth daily.   Ferrous Sulfate (IRON) 325 (65 Fe) MG TABS Take 325 mg by mouth daily.    Potassium 99 MG TABS 1 tablet at bedtime.   ibuprofen (ADVIL,MOTRIN) 200 MG tablet Take 200 mg by mouth every 6 (six) hours as needed for headache (Takes 2-4 tablets prn headache).  (Patient not taking: Reported on 03/10/2021)   No facility-administered encounter medications on file as of 03/10/2021.    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  HENT:  Negative for congestion and sore throat.   Eyes:  Negative for visual disturbance.  Respiratory:  Negative for cough.   Cardiovascular:  Negative for chest pain.  Gastrointestinal:  Negative for abdominal pain and nausea.  Genitourinary:  Negative for dysuria.  Musculoskeletal:  Negative for back pain and joint swelling.  Skin:  Negative for rash.  Neurological:  Negative for weakness and headaches.  Hematological:  Negative for adenopathy.   PE; Blood pressure (!) 153/79, pulse 68, temperature 98.7 F (37.1 C), temperature source Oral, height 5\' 1"  (1.549 m), weight 195 lb (88.5 kg), last menstrual period 02/22/2021, SpO2 98 %.Body mass index is 36.84 kg/m.  Gen: Alert, well appearing.  Patient is oriented to person, place, time, and situation. AFFECT: pleasant, lucid thought and speech. 02/24/2021: no injection, icteris, swelling, or exudate.  EOMI, PERRLA. Mouth: lips without lesion/swelling.  Oral mucosa pink and moist. Oropharynx without erythema, exudate, or swelling.  CV: RRR, no m/r/g.   LUNGS: CTA bilat, nonlabored resps, good aeration in all lung fields. EXT: no clubbing or cyanosis.  no edema.   Pertinent labs:  Lab Results  Component Value Date   TSH 1.30 11/17/2020   Lab Results  Component Value Date   WBC 8.2 11/17/2020   HGB 13.8 11/17/2020   HCT 42.5 11/17/2020   MCV 90.2 11/17/2020   PLT 271 11/17/2020   Lab Results  Component Value Date   CREATININE 1.02 11/17/2020   BUN 13 11/17/2020   NA 139 11/17/2020   K 4.3 11/17/2020   CL 104 11/17/2020   CO2 26 11/17/2020    Lab Results  Component Value Date   ALT 11 11/17/2020   AST 14 11/17/2020   ALKPHOS 43 (L) 11/13/2019   BILITOT 0.6 11/17/2020   Lab Results  Component Value Date   CHOL 199 11/17/2020   Lab Results  Component Value Date   HDL 72 11/17/2020   Lab Results  Component Value Date   LDLCALC 110 (H) 11/17/2020   Lab Results  Component Value Date   TRIG 78 11/17/2020   Lab Results  Component Value Date   CHOLHDL 2.8 11/17/2020   ASSESSMENT  AND PLAN:   New patient, establishing care.  1.  Hypertension.  New diagnosis/has never been treated. She prefers to take a nonmedication approach at this time.  She we will work on a [place] and increase cardiovascular exercise and try to lose weight.  We will have her get a home blood pressure monitor and check a few times a week and we will review these numbers at her next follow-up.  We will give it 6 months before considering starting medication, but will do so earlier if blood pressures consistently rise.  Patient is up-to-date on Pap and mammogram this year.  She is also up-to-date on shingles, tetanus, and flu shots. She had normal cholesterol, complete metabolic panel, complete blood count, and TSH just 3 months ago.  An After Visit Summary was printed and given to the patient.  Return in about 6 months (around 09/07/2021) for f/u HTN.  Signed:  Crissie Sickles, MD           03/10/2021

## 2021-06-06 IMAGING — MG DIGITAL SCREENING BILAT W/ TOMO W/ CAD
8 series · 8 of 24 positions shown · non-contrast
Comparison: Previous exam(s).

CLINICAL DATA: Screening.

EXAM:
DIGITAL SCREENING BILATERAL MAMMOGRAM WITH TOMO AND CAD

[L MLO synth-2D]
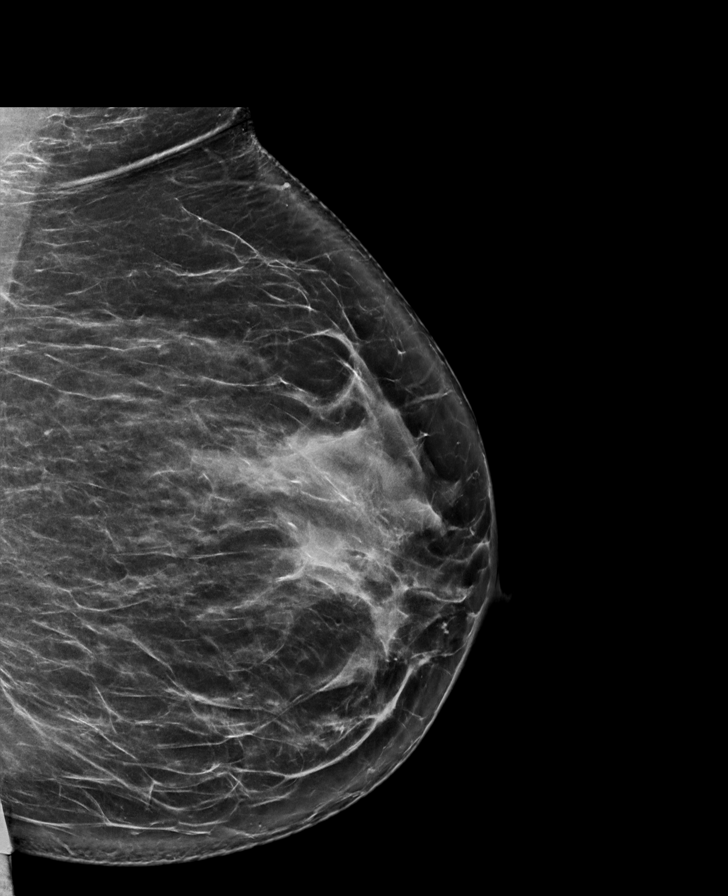

[R CC synth-2D]
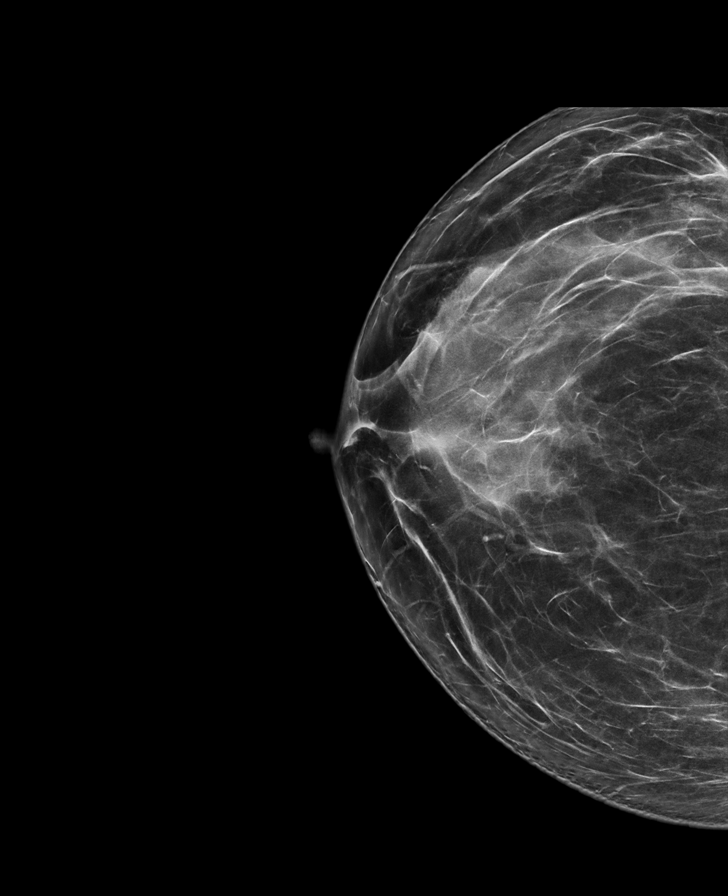

[L CC synth-2D]
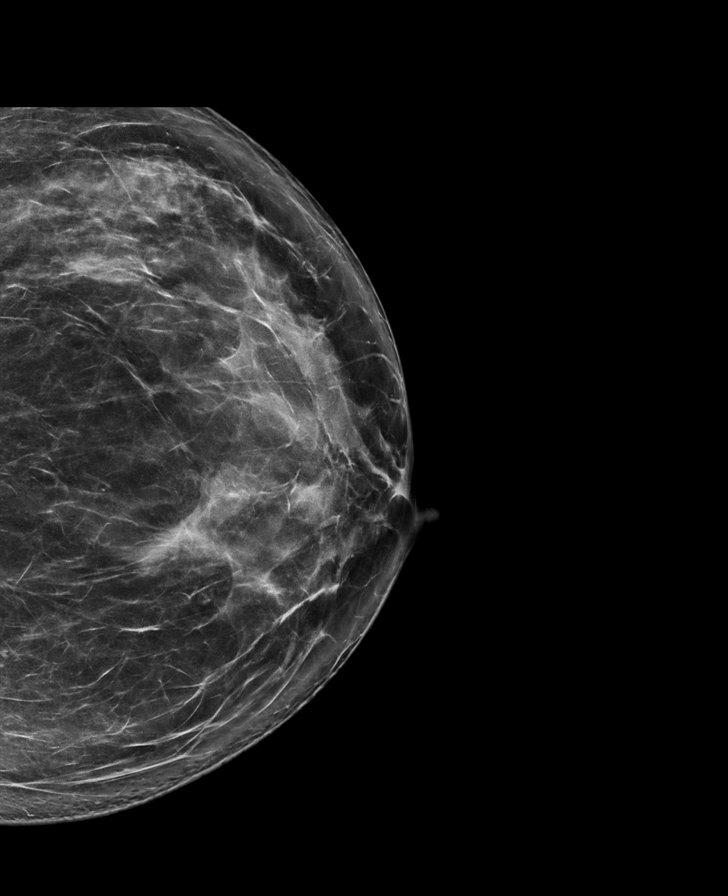

[R MLO synth-2D]
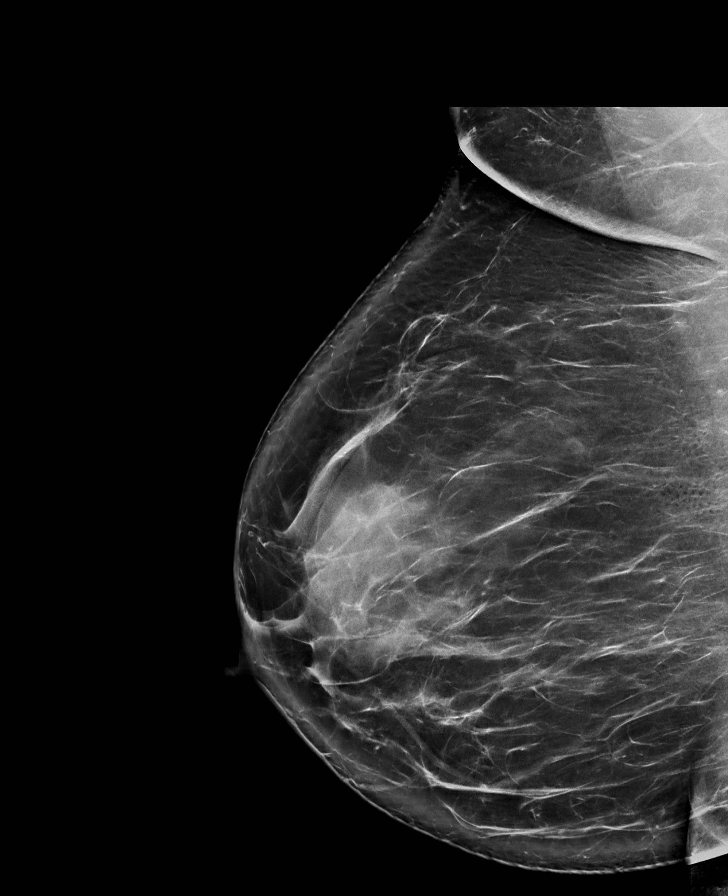

[L CC tomo · tomo slice 45/88.0]
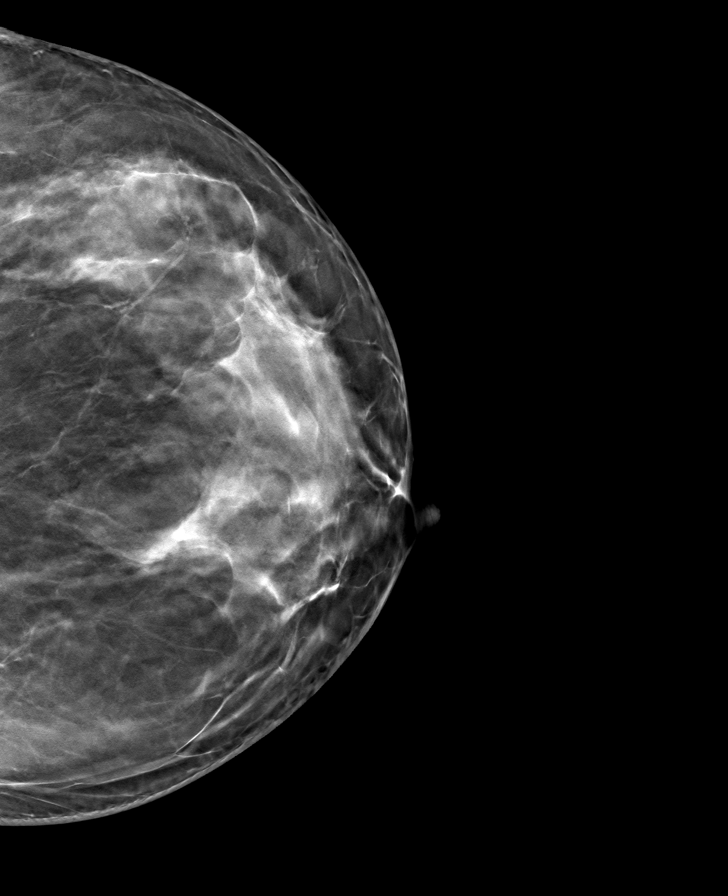

[R CC tomo · tomo slice 43/84.0]
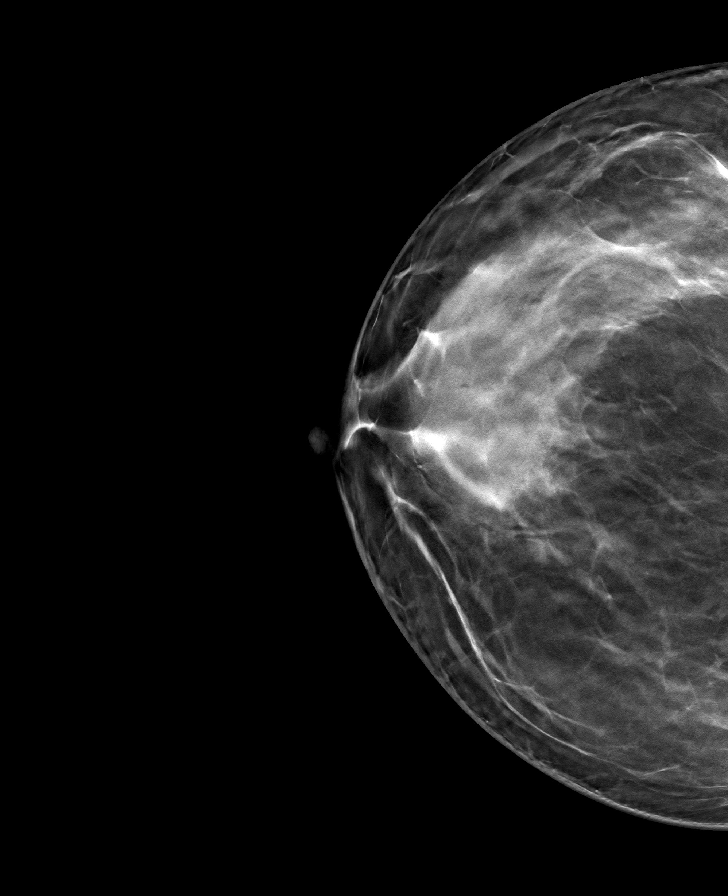

[R MLO tomo · tomo slice 52/103.0]
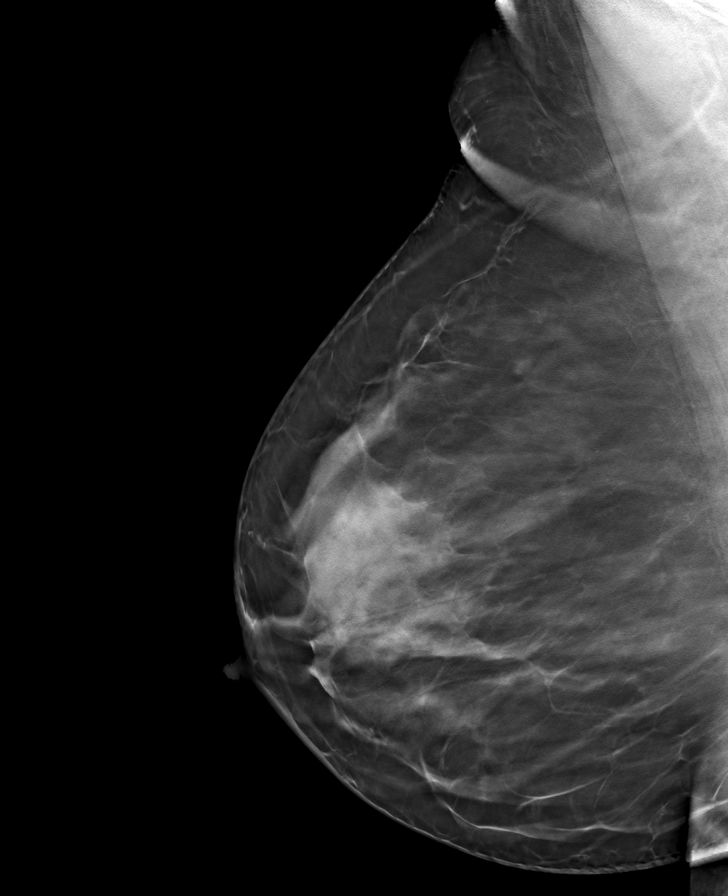

[L MLO tomo · tomo slice 45/90.0]
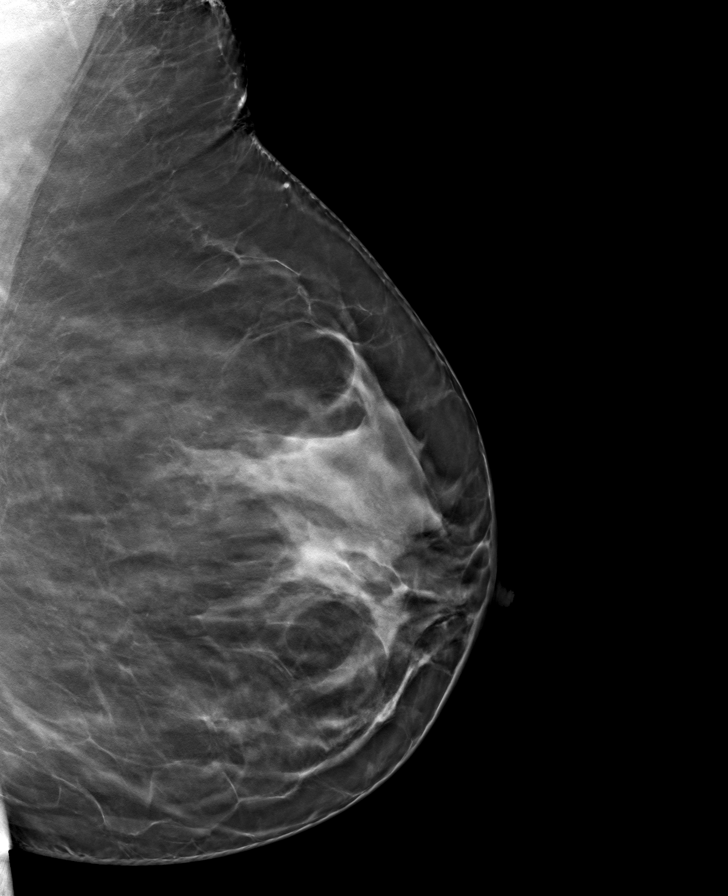

[8 of 24 positions shown; findings below may reference images not displayed]

ACR Breast Density Category c: The breast tissue is heterogeneously
dense, which may obscure small masses.
FINDINGS: There are no findings suspicious for malignancy. Images were
processed with CAD.
IMPRESSION: No mammographic evidence of malignancy. A result letter of this
screening mammogram will be mailed directly to the patient.

RECOMMENDATION:
Screening mammogram in one year. (Code:FT-U-LHB)

BI-RADS CATEGORY  1: Negative.

## 2021-07-23 ENCOUNTER — Encounter: Payer: Self-pay | Admitting: Family Medicine

## 2021-07-23 ENCOUNTER — Ambulatory Visit: Payer: BC Managed Care – PPO | Admitting: Family Medicine

## 2021-07-23 VITALS — BP 145/92 | HR 82 | Temp 99.0°F | Ht 61.0 in | Wt 192.4 lb

## 2021-07-23 DIAGNOSIS — I10 Essential (primary) hypertension: Secondary | ICD-10-CM | POA: Diagnosis not present

## 2021-07-23 MED ORDER — HYDROCHLOROTHIAZIDE 25 MG PO TABS: 25.0000 mg | ORAL_TABLET | Freq: Every day | ORAL | 0 refills | Status: DC

## 2021-07-23 NOTE — Progress Notes (Signed)
OFFICE VISIT ? ?07/23/2021 ? ?CC:  ?Chief Complaint  ?Patient presents with  ? Hypertension  ?  Follow up; pt is not fasting  ? ?Patient is a 56 y.o. female who presents for follow-up hypertension. ?I last saw her 4 months ago for her establish care visit. ?A/P as of last visit: ?"1.  Hypertension.  New diagnosis/has never been treated. ?She prefers to take a nonmedication approach at this time.  She we will work on a DASH diet and increase cardiovascular exercise and try to lose weight.  We will have her get a home blood pressure monitor and check a few times a week and we will review these numbers at her next follow-up.  We will give it 6 months before considering starting medication, but will do so earlier if blood pressures consistently rise. ?  ?Patient is up-to-date on Pap and mammogram this year.  She is also up-to-date on shingles, tetanus, and flu shots. ?She had normal cholesterol, complete metabolic panel, complete blood count, and TSH just 3 months ago" ? ?INTERIM HX: ?She feels well physically but is quite stressed the last 6 weeks.  Lots of family illnesses and travel.  Just finishing with the process of getting her father-in-law in a assisted living facility, power of attorney paperwork, etc.   ?Her blood pressure has consistently been in the 150s to 170s systolic over the 80s to 100s diastolic. ?She is ready to start medication. ?Since I last saw her she has made some improvements in her diet but these have not been as consistent the last 6 weeks. ?Exercise habits have not been very good. ? ?ROS : no fevers, no CP, no SOB, no wheezing, no cough, no dizziness, no HAs, no rashes, no melena/hematochezia.  No polyuria or polydipsia.  No myalgias or arthralgias.  No focal weakness, paresthesias, or tremors.  No acute vision or hearing abnormalities.  No dysuria or unusual/new urinary urgency or frequency.  No recent changes in lower legs. ?No n/v/d or abd pain.  No palpitations.   ? ?Past Medical History:   ?Diagnosis Date  ? Dyshidrotic dermatitis   ? Essential hypertension   ? Frequent headaches   ? Heart murmur   ? Pt states started after having scarlet fever as a child, "comes and goes" since then.  Has been evaluated by cardiologist--no premeds needed for surgery/procedures  ? ? ?Past Surgical History:  ?Procedure Laterality Date  ? COLONOSCOPY  12/26/2017  ? NORMAL. recall 2029.  ? CYST EXCISION  2002  ? benign cyst removed from Rt. underarm  ? HERNIA REPAIR    ? Bilat, inguinal, 1973 and 76.  ? KIDNEY SURGERY  1978  ? 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  ? ? ?Outpatient Medications Prior to Visit  ?Medication Sig Dispense Refill  ? Calcium Carbonate-Vit D-Min (CALCIUM 1200 PO) Take by mouth daily.    ? Cholecalciferol (VITAMIN D3) 1000 units CAPS Take 1,000 Units by mouth daily.    ? Ferrous Sulfate (IRON) 325 (65 Fe) MG TABS Take 325 mg by mouth daily.     ? ibuprofen (ADVIL,MOTRIN) 200 MG tablet Take 200 mg by mouth every 6 (six) hours as needed for headache (Takes 2-4 tablets prn headache).    ? Potassium 99 MG TABS 1 tablet at bedtime.    ? ?No facility-administered medications prior to visit.  ? ? ?Allergies  ?Allergen Reactions  ? Penicillins Anaphylaxis  ? Vancomycin Other (See Comments)  ?  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 Asa [Aspirin]  Rash  ? Bactrim [Sulfamethoxazole-Trimethoprim] Rash  ? ? ?ROS ?As per HPI ? ?PE: ? ?  07/23/2021  ? 10:00 AM 03/10/2021  ?  3:05 PM 11/17/2020  ?  3:06 PM  ?Vitals with BMI  ?Height 5\' 1"  5\' 1"  5' 1.5"  ?Weight 192 lbs 6 oz 195 lbs 198 lbs  ?BMI 36.37 36.86 36.81  ?Systolic 145 153  ?Diastolic 92 79 84  ?Pulse 82 68 91  ? ? ? ?Physical Exam ? ?Gen: Alert, well appearing.  Patient is oriented to person, place, time, and situation. ?AFFECT: pleasant, lucid thought and speech. ?CV: RRR, no m/r/g.   ?LUNGS: CTA bilat, nonlabored resps, good aeration in all lung fields. ?EXT: no clubbing or cyanosis.  no edema.  ? ? ?LABS:  ?Last CBC ?Lab Results  ?Component Value Date  ?  WBC 8.2 11/17/2020  ? HGB 13.8 11/17/2020  ? HCT 42.5 11/17/2020  ? MCV 90.2 11/17/2020  ? MCH 29.3 11/17/2020  ? RDW 12.9 11/17/2020  ? PLT 271 11/17/2020  ? ?Last metabolic panel ?Lab Results  ?Component Value Date  ? GLUCOSE 96 11/17/2020  ? NA 139 11/17/2020  ? K 4.3 11/17/2020  ? CL 104 11/17/2020  ? CO2 26 11/17/2020  ? BUN 13 11/17/2020  ? CREATININE 1.02 11/17/2020  ? GFRNONAA 73 11/13/2019  ? CALCIUM 9.5 11/17/2020  ? PROT 7.2 11/17/2020  ? ALBUMIN 4.7 11/13/2019  ? LABGLOB 2.5 11/13/2019  ? AGRATIO 1.9 11/13/2019  ? BILITOT 0.6 11/17/2020  ? ALKPHOS 43 (L) 11/13/2019  ? AST 14 11/17/2020  ? ALT 11 11/17/2020  ? ?Last lipids ?Lab Results  ?Component Value Date  ? CHOL 199 11/17/2020  ? HDL 72 11/17/2020  ? LDLCALC 110 (H) 11/17/2020  ? TRIG 78 11/17/2020  ? CHOLHDL 2.8 11/17/2020  ? ?Last thyroid functions ?Lab Results  ?Component Value Date  ? TSH 1.30 11/17/2020  ? ?IMPRESSION AND PLAN: ? ?#1 uncontrolled hypertension. ?Start HCTZ 25 mg a day.  Therapeutic expectations and side effect profile of medication discussed today.  Patient's questions answered. ?Electrolytes and creatinine normal August 2022. ?We will recheck basic metabolic panel upon follow-up in 2 weeks.  She does already take 1 over-the-counter potassium tablet ? ?She will continue to work on improving diet and exercise habits. ?She will continue to regularly monitor her blood pressure and pulse at home and we will review these numbers together in 2 weeks. ? ?An After Visit Summary was printed and given to the patient. ? ?FOLLOW UP: Return in about 2 weeks (around 08/06/2021) for f/u HTN. ? ?Signed:  September 2022, MD           07/23/2021 ? ?

## 2021-08-06 ENCOUNTER — Ambulatory Visit: Payer: BC Managed Care – PPO | Admitting: Family Medicine

## 2021-08-06 ENCOUNTER — Encounter: Payer: Self-pay | Admitting: Family Medicine

## 2021-08-06 VITALS — BP 107/71 | HR 82 | Temp 98.3°F | Ht 61.0 in | Wt 193.6 lb

## 2021-08-06 DIAGNOSIS — I10 Essential (primary) hypertension: Secondary | ICD-10-CM | POA: Diagnosis not present

## 2021-08-06 LAB — BASIC METABOLIC PANEL
BUN: 19 mg/dL (ref 6–23)
CO2: 30 mEq/L (ref 19–32)
Calcium: 9.6 mg/dL (ref 8.4–10.5)
Chloride: 101 mEq/L (ref 96–112)
Creatinine, Ser: 0.99 mg/dL (ref 0.40–1.20)
GFR: 64.9 mL/min (ref >60.00)
Glucose, Bld: 104 mg/dL — ABNORMAL HIGH (ref 70–99)
Potassium: 4.1 mEq/L (ref 3.5–5.1)
Sodium: 139 mEq/L (ref 135–145)

## 2021-08-06 MED ORDER — HYDROCHLOROTHIAZIDE 25 MG PO TABS: 25.0000 mg | ORAL_TABLET | Freq: Every day | ORAL | 1 refills | Status: DC

## 2021-08-06 NOTE — Progress Notes (Signed)
OFFICE VISIT ? ?08/06/2021 ? ?CC:  ?Chief Complaint  ?Patient presents with  ? Hypertension  ?  Follow up; pt is not fasting  ? ? ?Patient is a 56 y.o. female who presents for 2 week follow-up hypertension. ?A/P as of last visit: ?"#1 uncontrolled hypertension. ?Start HCTZ 25 mg a day.  Therapeutic expectations and side effect profile of medication discussed today.  Patient's questions answered. ?Electrolytes and creatinine normal August 2022. ?We will recheck basic metabolic panel upon follow-up in 2 weeks.  She does already take 1 over-the-counter potassium tablet ?  ?She will continue to work on improving diet and exercise habits. ?She will continue to regularly monitor her blood pressure and pulse at home and we will review these numbers together in 2 weeks." ? ?INTERIM HX: ?She feels well. ?HCTZ does make her urinate a little bit more but nothing too unusual. ?Home blood pressures reviewed: Range 125-160 systolic, 80s diastolic.  Heart rate average 80. ?Blood pressure here today is 107/71. ? ?Past Medical History:  ?Diagnosis Date  ? Dyshidrotic dermatitis   ? Essential hypertension   ? Frequent headaches   ? Heart murmur   ? Pt states started after having scarlet fever as a child, "comes and goes" since then.  Has been evaluated by cardiologist--no premeds needed for surgery/procedures  ? ? ?Past Surgical History:  ?Procedure Laterality Date  ? COLONOSCOPY  12/26/2017  ? NORMAL. recall 2029.  ? CYST EXCISION  2002  ? benign cyst removed from Rt. underarm  ? HERNIA REPAIR    ? Bilat, inguinal, 1973 and 76.  ? KIDNEY SURGERY  1978  ? 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  ? ? ?Outpatient Medications Prior to Visit  ?Medication Sig Dispense Refill  ? Calcium Carbonate-Vit D-Min (CALCIUM 1200 PO) Take by mouth daily.    ? Cholecalciferol (VITAMIN D3) 1000 units CAPS Take 1,000 Units by mouth daily.    ? Ferrous Sulfate (IRON) 325 (65 Fe) MG TABS Take 325 mg by mouth daily.     ? ibuprofen (ADVIL,MOTRIN)  200 MG tablet Take 200 mg by mouth every 6 (six) hours as needed for headache (Takes 2-4 tablets prn headache).    ? Potassium 99 MG TABS 1 tablet at bedtime.    ? hydrochlorothiazide (HYDRODIURIL) 25 MG tablet Take 1 tablet (25 mg total) by mouth daily. 30 tablet 0  ? ?No facility-administered medications prior to visit.  ? ? ?Allergies  ?Allergen Reactions  ? Penicillins Anaphylaxis  ? Vancomycin Other (See Comments)  ?  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 Asa [Aspirin] Rash  ? Bactrim [Sulfamethoxazole-Trimethoprim] Rash  ? ? ?ROS ?As per HPI ? ?PE: ? ?  08/06/2021  ?  8:10 AM 07/23/2021  ? 10:00 AM 03/10/2021  ?  3:05 PM  ?Vitals with BMI  ?Height 5\' 1"  5\' 1"  5\' 1"   ?Weight 193 lbs 10 oz 192 lbs 6 oz 195 lbs  ?BMI 36.6 36.37 36.86  ?Systolic 107 145  ?Diastolic 71 92 79  ?Pulse 82 82 68  ? ? ? ?Physical Exam ? ?Gen: Alert, well appearing.  Patient is oriented to person, place, time, and situation. ?AFFECT: pleasant, lucid thought and speech. ?CV: RRR, no m/r/g.   ?LUNGS: CTA bilat, nonlabored resps, good aeration in all lung fields. ?EXT: no clubbing or cyanosis.  no edema.  ? ? ?LABS:  ?Last metabolic panel ?Lab Results  ?Component Value Date  ? GLUCOSE 96 11/17/2020  ? NA 139 11/17/2020  ? K 4.3  11/17/2020  ? CL 104 11/17/2020  ? CO2 26 11/17/2020  ? BUN 13 11/17/2020  ? CREATININE 1.02 11/17/2020  ? GFRNONAA 73 11/13/2019  ? CALCIUM 9.5 11/17/2020  ? PROT 7.2 11/17/2020  ? ALBUMIN 4.7 11/13/2019  ? LABGLOB 2.5 11/13/2019  ? AGRATIO 1.9 11/13/2019  ? BILITOT 0.6 11/17/2020  ? ALKPHOS 43 (L) 11/13/2019  ? AST 14 11/17/2020  ? ALT 11 11/17/2020  ? ?Lab Results  ?Component Value Date  ? WBC 8.2 11/17/2020  ? HGB 13.8 11/17/2020  ? HCT 42.5 11/17/2020  ? MCV 90.2 11/17/2020  ? PLT 271 11/17/2020  ? ?IMPRESSION AND PLAN: ? ?Hypertension, still uncontrolled per her home measurements but significantly improved compared to no meds.  Blood pressure here today is soft.  She feels well. ?Continue HCTZ 25 mg a day and continue home  BP monitoring and will recheck in 1 month to see her trend. ?I told her to call, send MyChart message, or return to office before her planned follow-up if blood pressures remaining consistently over 150 systolic or over 95 diastolic. ?Basic metabolic panel today. ? ?An After Visit Summary was printed and given to the patient. ? ?FOLLOW UP: Return in about 4 weeks (around 09/03/2021) for f/u HTN. ? ?Signed:  Santiago Bumpers, MD           08/06/2021 ? ?

## 2021-09-08 ENCOUNTER — Encounter: Payer: Self-pay | Admitting: Family Medicine

## 2021-09-08 ENCOUNTER — Ambulatory Visit: Payer: BC Managed Care – PPO | Admitting: Family Medicine

## 2021-09-08 VITALS — BP 113/77 | HR 75 | Temp 99.1°F | Ht 61.0 in | Wt 193.8 lb

## 2021-09-08 DIAGNOSIS — I10 Essential (primary) hypertension: Secondary | ICD-10-CM | POA: Diagnosis not present

## 2021-09-08 NOTE — Progress Notes (Signed)
OFFICE VISIT  09/08/2021  CC:  Chief Complaint  Patient presents with   Hypertension    Pt is not fasting   Patient is a 56 y.o. female who presents for 1 mo f/u HTN. A/P as of last visit: "Hypertension, still uncontrolled per her home measurements but significantly improved compared to no meds.  Blood pressure here today is soft.  She feels well. Continue HCTZ 25 mg a day and continue home BP monitoring and will recheck in 1 month to see her trend. I told her to call, send MyChart message, or return to office before her planned follow-up if blood pressures remaining consistently over 150 systolic or over 95 diastolic. Basic metabolic panel today"  INTERIM HX: Labs normal last visit.  She feels great. Compliant with HCTZ 25 mg a day. Home blood pressure checks around 130/80 or less consistently.  She denies dizziness.  Past Medical History:  Diagnosis Date   Dyshidrotic dermatitis    Essential hypertension    Frequent headaches    Heart murmur    Pt states started after having scarlet fever as a child, "comes and goes" since then.  Has been evaluated by cardiologist--no premeds needed for surgery/procedures    Past Surgical History:  Procedure Laterality Date   COLONOSCOPY  12/26/2017   NORMAL. recall 2029.   CYST EXCISION  2002   benign cyst removed from Rt. underarm   HERNIA REPAIR     Bilat, inguinal, 1973 and 76.   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    Outpatient Medications Prior to Visit  Medication Sig Dispense Refill   Calcium Carbonate-Vit D-Min (CALCIUM 1200 PO) Take by mouth daily.     Cholecalciferol (VITAMIN D3) 1000 units CAPS Take 1,000 Units by mouth daily.     Ferrous Sulfate (IRON) 325 (65 Fe) MG TABS Take 325 mg by mouth daily.      hydrochlorothiazide (HYDRODIURIL) 25 MG tablet Take 1 tablet (25 mg total) by mouth daily. 90 tablet 1   ibuprofen (ADVIL,MOTRIN) 200 MG tablet Take 200 mg by mouth every 6 (six) hours  as needed for headache (Takes 2-4 tablets prn headache).     Potassium 99 MG TABS 1 tablet at bedtime.     No facility-administered medications prior to visit.    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  Patient is oriented to person, place, time, and situation. AFFECT: pleasant, lucid thought and speech. CV: RRR, no m/r/g.   LUNGS: CTA bilat, nonlabored resps, good aeration in all lung fields. EXT: no clubbing or cyanosis.  no edema.    LABS:  Last CBC Lab Results  Component Value Date   WBC 8.2 11/17/2020   HGB 13.8 11/17/2020   HCT 42.5 11/17/2020   MCV 90.2 11/17/2020   MCH 29.3 11/17/2020   RDW 12.9 11/17/2020  PLT 271 11/17/2020   Last metabolic panel Lab Results  Component Value Date   GLUCOSE 104 (H) 08/06/2021   NA 139 08/06/2021   K 4.1 08/06/2021   CL 101 08/06/2021   CO2 30 08/06/2021   BUN 19 08/06/2021   CREATININE 0.99 08/06/2021   GFRNONAA 73 11/13/2019   CALCIUM 9.6 08/06/2021   PROT 7.2 11/17/2020   ALBUMIN 4.7 11/13/2019   LABGLOB 2.5 11/13/2019   AGRATIO 1.9 11/13/2019   BILITOT 0.6 11/17/2020   ALKPHOS 43 (L) 11/13/2019   AST 14 11/17/2020   ALT 11 11/17/2020   IMPRESSION AND PLAN:  Hypertension, well controlled on HCTZ 25 mg a day. Continue to monitor blood pressure about once a week and we will review these at her next follow-up for CPE in 6 months.  An After Visit Summary was printed and given to the patient.  FOLLOW UP: Return in about 6 months (around 03/10/2022) for  annual CPE (fasting).  Signed:  Santiago Bumpers, MD           09/08/2021

## 2021-11-15 ENCOUNTER — Other Ambulatory Visit: Payer: Self-pay | Admitting: Obstetrics and Gynecology

## 2021-11-15 DIAGNOSIS — Z1231 Encounter for screening mammogram for malignant neoplasm of breast: Secondary | ICD-10-CM

## 2021-11-16 NOTE — Progress Notes (Unsigned)
56 y.o. [id number] [name] here for annual exam.    PCP: Nicoletta Ba, MD  No LMP recorded. Patient is perimenopausal.           Sexually active: {yes no:314532}  The current method of family planning is ***abstinence.    Exercising: {yes no:314532}  {types:19826} Smoker:  no  Health Maintenance: Pap:  11-12-18 Neg:Neg HR HPV, 10-22-15 Neg:Neg HR HPV History of abnormal Pap:  no MMG: 01-04-21 Neg/BiRads1 Colonoscopy: 12-26-17 normal;10 years BMD:   ***  Result  *** TDaP:  11-12-18 Gardasil:   no HIV: donated blood Hep C: donated blood Screening Labs:  Hb today: ***, Urine today: ***   reports that she has never smoked. She has never used smokeless tobacco. She reports current alcohol use of about 4.0 standard drinks of alcohol per week. She reports that she does not use drugs.  Past Medical History:  Diagnosis Date   Dyshidrotic dermatitis    Essential hypertension    Frequent headaches    Heart murmur    Pt states started after having scarlet fever as a child, "comes and goes" since then.  Has been evaluated by cardiologist--no premeds needed for surgery/procedures    Past Surgical History:  Procedure Laterality Date   COLONOSCOPY  12/26/2017   NORMAL. recall 2029.   CYST EXCISION  2002   benign cyst removed from Rt. underarm   HERNIA REPAIR     Bilat, inguinal, 1973 and 74.   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    Current Outpatient Medications  Medication Sig Dispense Refill   Calcium Carbonate-Vit D-Min (CALCIUM 1200 PO) Take by mouth daily.     Cholecalciferol (VITAMIN D3) 1000 units CAPS Take 1,000 Units by mouth daily.     Ferrous Sulfate (IRON) 325 (65 Fe) MG TABS Take 325 mg by mouth daily.      hydrochlorothiazide (HYDRODIURIL) 25 MG tablet Take 1 tablet (25 mg total) by mouth daily. 90 tablet 1   ibuprofen (ADVIL,MOTRIN) 200 MG tablet Take 200 mg by mouth every 6 (six) hours as needed for headache (Takes 2-4  tablets prn headache).     Potassium 99 MG TABS 1 tablet at bedtime.     No current facility-administered medications for this visit.    Family History  Problem Relation Age of Onset   Hypertension Mother    Thyroid disease Mother    Arthritis Mother    Heart attack Father        Dec age 85   Hypertension Father    Hyperlipidemia Father    Early death Father        age 66   Heart attack Brother    Pulmonary Hypertension Brother    Early death Brother    Heart disease Brother    Learning disabilities Brother    Hypertension Maternal Grandmother    Arthritis Maternal Grandmother    Cancer Maternal Grandmother    Hypertension Maternal Grandfather    Alcohol abuse Maternal Grandfather    Heart attack Maternal Grandfather    Arthritis Maternal Grandfather    Hypertension Paternal Grandmother    Cancer Paternal Grandmother    Hearing loss Paternal Grandmother    Arthritis Paternal Grandmother    Hypertension Paternal Grandfather    Arthritis Paternal Grandfather    COPD Paternal Grandfather    Heart attack Paternal Grandfather    Healthy Son    Colon cancer Neg Hx    Esophageal cancer  Neg Hx    Rectal cancer Neg Hx    Stomach cancer Neg Hx    Colon polyps Neg Hx    Breast cancer Neg Hx     Review of Systems  Exam:   There were no vitals taken for this visit.    General appearance: alert, cooperative and appears stated age Head: normocephalic, without obvious abnormality, atraumatic Neck: no adenopathy, supple, symmetrical, trachea midline and thyroid normal to inspection and palpation Lungs: clear to auscultation bilaterally Breasts: normal appearance, no masses or tenderness, No nipple retraction or dimpling, No nipple discharge or bleeding, No axillary adenopathy Heart: regular rate and rhythm Abdomen: soft, non-tender; no masses, no organomegaly Extremities: extremities normal, atraumatic, no cyanosis or edema Skin: skin color, texture, turgor normal. No rashes  or lesions Lymph nodes: cervical, supraclavicular, and axillary nodes normal. Neurologic: grossly normal  Pelvic: External genitalia:  no lesions              No abnormal inguinal nodes palpated.              Urethra:  normal appearing urethra with no masses, tenderness or lesions              Bartholins and Skenes: normal                 Vagina: normal appearing vagina with normal color and discharge, no lesions              Cervix: no lesions              Pap taken: {yes no:314532} Bimanual Exam:  Uterus:  normal size, contour, position, consistency, mobility, non-tender              Adnexa: no mass, fullness, tenderness              Rectal exam: {yes no:314532}.  Confirms.              Anus:  normal sphincter tone, no lesions  Chaperone was present for exam:  ***  Assessment:   Well woman visit with gynecologic exam.   Plan: Mammogram screening discussed. Self breast awareness reviewed. Pap and HR HPV as above. Guidelines for Calcium, Vitamin D, regular exercise program including cardiovascular and weight bearing exercise.   Follow up annually and prn.   Additional counseling given.  {yes [id number]. _______ minutes face to face time of which over 50% was spent in counseling.    After visit summary provided.

## 2021-11-18 ENCOUNTER — Encounter: Payer: Self-pay | Admitting: Obstetrics and Gynecology

## 2021-11-18 ENCOUNTER — Other Ambulatory Visit (HOSPITAL_COMMUNITY)
Admission: RE | Admit: 2021-11-18 | Discharge: 2021-11-18 | Disposition: A | Discharge status: Home / Self Care | Payer: BC Managed Care – PPO | Source: Ambulatory Visit | Attending: Obstetrics and Gynecology | Admitting: Obstetrics and Gynecology

## 2021-11-18 ENCOUNTER — Ambulatory Visit (INDEPENDENT_AMBULATORY_CARE_PROVIDER_SITE_OTHER): Payer: BC Managed Care – PPO | Admitting: Obstetrics and Gynecology

## 2021-11-18 VITALS — BP 136/82 | HR 70 | Ht 62.5 in | Wt 195.0 lb

## 2021-11-18 DIAGNOSIS — N951 Menopausal and female climacteric states: Secondary | ICD-10-CM | POA: Diagnosis not present

## 2021-11-18 DIAGNOSIS — Z124 Encounter for screening for malignant neoplasm of cervix: Secondary | ICD-10-CM | POA: Insufficient documentation

## 2021-11-18 DIAGNOSIS — N889 Noninflammatory disorder of cervix uteri, unspecified: Secondary | ICD-10-CM | POA: Diagnosis not present

## 2021-11-18 DIAGNOSIS — Z01419 Encounter for gynecological examination (general) (routine) without abnormal findings: Secondary | ICD-10-CM | POA: Diagnosis not present

## 2021-11-18 NOTE — Patient Instructions (Signed)

## 2021-11-22 LAB — CYTOLOGY - PAP
Comment: NEGATIVE
Diagnosis: NEGATIVE
Diagnosis: REACTIVE
High risk HPV: NEGATIVE

## 2021-12-17 NOTE — Progress Notes (Unsigned)
  Subjective:     Patient ID: Ann West, female   DOB: [birth year], 56 y.o.   MRN: [id number]  HPI Patient here today for colposcopy with a  white raised area about 7 mm  at 3:00 position on cervix.  Pap 11-18-21 Neg:Neg HR HPV  Review of Systems  All other systems reviewed and are negative.  Hot flashes this summer.   LMP: 08-19-21 Contraception: Withdrawal     Objective:   Physical Exam  Colposcopy - cervix, vagina. Consent for procedure.  3% acetic acid used in vagina and on cervix. White light and green light filter used.  Colposcopy satisfactory:  Yes   __x___          No    _____ Findings:   Very, very small amount of vaginal blood coming from the os.  Cervix:  raised aceotwhite lesion at 2:00.  Partially wiped off but remaining acetowhite change remained on cervix. Vagina: no lesions.  Biopsies: biopsy at 2:00.  Tissue to pathology. Monsel's placed.  Minimal EBL. No complications.      Assessment:     Cervical lesions.  Normal and negative HR HPV testing.     Plan:     Fu biopsy results.

## 2021-12-20 ENCOUNTER — Other Ambulatory Visit (HOSPITAL_COMMUNITY)
Admission: RE | Admit: 2021-12-20 | Discharge: 2021-12-20 | Disposition: A | Discharge status: Home / Self Care | Payer: BC Managed Care – PPO | Source: Ambulatory Visit | Attending: Obstetrics and Gynecology | Admitting: Obstetrics and Gynecology

## 2021-12-20 ENCOUNTER — Ambulatory Visit: Payer: BC Managed Care – PPO | Admitting: Obstetrics and Gynecology

## 2021-12-20 ENCOUNTER — Encounter: Payer: Self-pay | Admitting: Obstetrics and Gynecology

## 2021-12-20 VITALS — BP 136/78 | HR 70 | Ht 62.4 in | Wt 195.0 lb

## 2021-12-20 DIAGNOSIS — Z01812 Encounter for preprocedural laboratory examination: Secondary | ICD-10-CM

## 2021-12-20 DIAGNOSIS — N889 Noninflammatory disorder of cervix uteri, unspecified: Secondary | ICD-10-CM | POA: Insufficient documentation

## 2021-12-20 LAB — PREGNANCY, URINE: Preg Test, Ur: NEGATIVE

## 2021-12-20 NOTE — Patient Instructions (Signed)
Colposcopy, Care After  The following information offers guidance on how to care for yourself after your procedure. Your health care provider may also give you more specific instructions. If you have problems or questions, contact your health care provider. What can I expect after the procedure? If you had a colposcopy without a biopsy, you can expect to feel fine right away after your procedure. However, you may have some spotting of blood for a few days. You can return to your normal activities. If you had a colposcopy with a biopsy, it is common after the procedure to have: Soreness and mild pain. These may last for a few days. Mild vaginal bleeding or discharge that is dark-colored and grainy. This may last for a few days. The discharge may be caused by a liquid (solution) that was used during the procedure. You may need to wear a sanitary pad during this time. Spotting of blood for at least 48 hours after the procedure. Follow these instructions at home: Medicines Take over-the-counter and prescription medicines only as told by your health care provider. Talk with your health care provider about what type of over-the-counter pain medicines and prescription medicines you can start to take again. It is especially important to talk with your health care provider if you take blood thinners. Activity Avoid using douche products, using tampons, and having sex for at least 3 days after the procedure or for as long as told by your health care provider. Return to your normal activities as told by your health care provider. Ask your health care provider what activities are safe for you. General instructions Ask your health care provider if you may take baths, swim, or use a hot tub. You may take showers. If you use birth control (contraception), continue to use it. Keep all follow-up visits. This is important. Contact a health care provider if: You have a fever or chills. You faint or feel  light-headed. Get help right away if: You have heavy bleeding from your vagina or pass blood clots. Heavy bleeding is bleeding that soaks through a sanitary pad in less than 1 hour. You have vaginal discharge that is abnormal, is yellow in color, or smells bad. This could be a sign of infection. You have severe pain or cramps in your lower abdomen that do not go away with medicine. Summary If you had a colposcopy without a biopsy, you can expect to feel fine right away, but you may have some spotting of blood for a few days. You can return to your normal activities. If you had a colposcopy with a biopsy, it is common to have mild pain for a few days and spotting for 48 hours after the procedure. Avoid using douche products, using tampons, and having sex for at least 3 days after the procedure or for as long as told by your health care provider. Get help right away if you have heavy bleeding, severe pain, or signs of infection. This information is not intended to replace advice given to you by your health care provider. Make sure you discuss any questions you have with your health care provider. Document Revised: 08/23/2020 Document Reviewed: 08/23/2020 Elsevier Patient Education  2023 Elsevier Inc.  

## 2021-12-22 LAB — SURGICAL PATHOLOGY

## 2022-01-05 ENCOUNTER — Ambulatory Visit
Admission: RE | Admit: 2022-01-05 | Discharge: 2022-01-05 | Disposition: A | Discharge status: Home / Self Care | Payer: BC Managed Care – PPO | Source: Ambulatory Visit

## 2022-01-05 DIAGNOSIS — Z1231 Encounter for screening mammogram for malignant neoplasm of breast: Secondary | ICD-10-CM

## 2022-02-14 ENCOUNTER — Other Ambulatory Visit: Payer: Self-pay | Admitting: Family Medicine

## 2022-03-14 NOTE — Patient Instructions (Signed)

## 2022-03-15 ENCOUNTER — Ambulatory Visit (INDEPENDENT_AMBULATORY_CARE_PROVIDER_SITE_OTHER): Payer: BC Managed Care – PPO | Admitting: Family Medicine

## 2022-03-15 ENCOUNTER — Encounter: Payer: Self-pay | Admitting: Family Medicine

## 2022-03-15 VITALS — BP 118/82 | HR 72 | Temp 99.5°F | Ht 63.0 in | Wt 199.6 lb

## 2022-03-15 DIAGNOSIS — I10 Essential (primary) hypertension: Secondary | ICD-10-CM

## 2022-03-15 DIAGNOSIS — Z0000 Encounter for general adult medical examination without abnormal findings: Secondary | ICD-10-CM | POA: Diagnosis not present

## 2022-03-15 DIAGNOSIS — Z23 Encounter for immunization: Secondary | ICD-10-CM

## 2022-03-15 LAB — COMPREHENSIVE METABOLIC PANEL
ALT: 14 U/L (ref 0–35)
AST: 17 U/L (ref 0–37)
Albumin: 4.6 g/dL (ref 3.5–5.2)
Alkaline Phosphatase: 35 U/L — ABNORMAL LOW (ref 39–117)
BUN: 17 mg/dL (ref 6–23)
CO2: 31 mEq/L (ref 19–32)
Calcium: 9.5 mg/dL (ref 8.4–10.5)
Chloride: 102 mEq/L (ref 96–112)
Creatinine, Ser: 1.08 mg/dL (ref 0.40–1.20)
GFR: 58.21 mL/min — ABNORMAL LOW (ref >60.00)
Glucose, Bld: 101 mg/dL — ABNORMAL HIGH (ref 70–99)
Potassium: 4.5 mEq/L (ref 3.5–5.1)
Sodium: 141 mEq/L (ref 135–145)
Total Bilirubin: 0.5 mg/dL (ref 0.2–1.2)
Total Protein: 7.1 g/dL (ref 6.0–8.3)

## 2022-03-15 LAB — CBC
HCT: 41.2 % (ref 36.0–46.0)
Hemoglobin: 14 g/dL (ref 12.0–15.0)
MCHC: 34 g/dL (ref 30.0–36.0)
MCV: 88.1 fl (ref 78.0–100.0)
Platelets: 240 10*3/uL (ref 150.0–400.0)
RBC: 4.68 Mil/uL (ref 3.87–5.11)
RDW: 14 % (ref 11.5–15.5)
WBC: 5.6 10*3/uL (ref 4.0–10.5)

## 2022-03-15 LAB — LIPID PANEL
Cholesterol: 196 mg/dL (ref 0–200)
HDL: 70 mg/dL (ref >39.00)
LDL Cholesterol: 106 mg/dL — ABNORMAL HIGH (ref 0–99)
NonHDL: 126.06
Total CHOL/HDL Ratio: 3
Triglycerides: 102 mg/dL (ref 0.0–149.0)
VLDL: 20.4 mg/dL (ref 0.0–40.0)

## 2022-03-15 LAB — TSH: TSH: 0.9 u[IU]/mL (ref 0.35–5.50)

## 2022-03-15 MED ORDER — HYDROCHLOROTHIAZIDE 25 MG PO TABS: 25.0000 mg | ORAL_TABLET | Freq: Every day | ORAL | 1 refills | Status: DC

## 2022-03-15 NOTE — Progress Notes (Signed)
Office Note 03/15/2022  CC:  Chief Complaint  Patient presents with   Annual Exam    Pt is fasting    HPI:  Patient is a 56 y.o. female who is here for annual health maintenance exam and follow-up hypertension.  She feels well. Home blood pressures consistently less than 130/80.  Teaching at [employer].  Past Medical History:  Diagnosis Date   Dyshidrotic dermatitis    Essential hypertension    Frequent headaches    Heart murmur    Pt states started after having scarlet fever as a child, "comes and goes" since then.  Has been evaluated by cardiologist--no premeds needed for surgery/procedures    Past Surgical History:  Procedure Laterality Date   COLONOSCOPY  12/26/2017   NORMAL. recall 2029.   CYST EXCISION  2002   benign cyst removed from Rt. underarm   HERNIA REPAIR     Bilat, inguinal, 1973 and 74.   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    Family History  Problem Relation Age of Onset   Hypertension Mother    Thyroid disease Mother    Arthritis Mother    Heart attack Father        Dec age 77   Hypertension Father    Hyperlipidemia Father    Early death Father        age 7   Heart attack Brother    Pulmonary Hypertension Brother    Early death Brother    Heart disease Brother    Learning disabilities Brother    Hypertension Maternal Grandmother    Arthritis Maternal Grandmother    Cancer Maternal Grandmother    Hypertension Maternal Grandfather    Alcohol abuse Maternal Grandfather    Heart attack Maternal Grandfather    Arthritis Maternal Grandfather    Hypertension Paternal Grandmother    Cancer Paternal Grandmother    Hearing loss Paternal Grandmother    Arthritis Paternal Grandmother    Hypertension Paternal Grandfather    Arthritis Paternal Grandfather    COPD Paternal Grandfather    Heart attack Paternal Grandfather    Healthy Son    Colon cancer Neg Hx    Esophageal cancer Neg Hx    Rectal cancer  Neg Hx    Stomach cancer Neg Hx    Colon polyps Neg Hx    Breast cancer Neg Hx     Social History   Socioeconomic History   Marital status: Married    Spouse name: Not on file   Number of children: Not on file   Years of education: Not on file   Highest education level: Master's degree (e.g., MA, MS, MEng, MEd, MSW, MBA)  Occupational History   Not on file  Tobacco Use   Smoking status: Never   Smokeless tobacco: Never  Vaping Use   Vaping Use: Never used  Substance and Sexual Activity   Alcohol use: Yes    Alcohol/week: 3.0 standard drinks of alcohol    Types: 3 Standard drinks or equivalent per week   Drug use: No   Sexual activity: Yes    Partners: Male    Comment: withdrawal  Other Topics Concern   Not on file  Social History Narrative   Married, 1 son.   Originally from [place] near [place].   Relocated to [facility] approximately 2005.   Masters in education.   High school Barrister's clerk, [employer].   Occasional alcohol.  No  tobacco or drugs.   Social Determinants of Health   Financial Resource Strain: Low Risk  (07/22/2021)   Overall Financial Resource Strain (CARDIA)    Difficulty of Paying Living Expenses: Not hard at all  Food Insecurity: No Food Insecurity (07/22/2021)   Hunger Vital Sign    Worried About Running Out of Food in the Last Year: Never true    Ran Out of Food in the Last Year: Never true  Transportation Needs: No Transportation Needs (07/22/2021)   PRAPARE - Administrator, Civil Service (Medical): No    Lack of Transportation (Non-Medical): No  Physical Activity: Insufficiently Active (07/22/2021)   Exercise Vital Sign    Days of Exercise per Week: 1 day    Minutes of Exercise per Session: 30 min  Stress: Stress Concern Present (07/22/2021)   [employer] of Occupational Health - Occupational Stress Questionnaire    Feeling of Stress : To some extent  Social Connections: Socially Integrated  (07/22/2021)   Social Connection and Isolation Panel [NHANES]    Frequency of Communication with Friends and Family: More than three times a week    Frequency of Social Gatherings with Friends and Family: Once a week    Attends Religious Services: More than 4 times per year    Active Member of [employer] or Organizations: Yes    Attends Engineer, structural: More than 4 times per year    Marital Status: Married  Catering manager Violence: Not on file    Outpatient Medications Prior to Visit  Medication Sig Dispense Refill   Calcium Carbonate-Vit D-Min (CALCIUM 1200 PO) Take by mouth daily.     Cholecalciferol (VITAMIN D3) 1000 units CAPS Take 1,000 Units by mouth daily.     Ferrous Sulfate (IRON) 325 (65 Fe) MG TABS Take 325 mg by mouth daily.      ibuprofen (ADVIL,MOTRIN) 200 MG tablet Take 200 mg by mouth every 6 (six) hours as needed for headache (Takes 2-4 tablets prn headache).     Potassium 99 MG TABS 1 tablet at bedtime.     hydrochlorothiazide (HYDRODIURIL) 25 MG tablet TAKE 1 TABLET (25 MG TOTAL) BY MOUTH DAILY. 90 tablet 0   No facility-administered medications prior to visit.    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  HENT:  Negative for congestion, dental problem, ear pain and sore throat.   Eyes:  Negative for discharge, redness and visual disturbance.  Respiratory:  Negative for cough, chest tightness, shortness of breath and wheezing.   Cardiovascular:  Negative for chest pain, palpitations and leg swelling.  Gastrointestinal:  Negative for abdominal pain, blood in stool, diarrhea, nausea and vomiting.  Genitourinary:  Negative for difficulty urinating, dysuria, flank pain, frequency, hematuria and urgency.   Musculoskeletal:  Negative for arthralgias, back pain, joint swelling, myalgias and neck stiffness.  Skin:  Negative for pallor and rash.  Neurological:  Negative for dizziness, speech difficulty, weakness and headaches.  Hematological:  Negative for adenopathy. Does not bruise/bleed easily.  Psychiatric/Behavioral:  Negative for confusion and sleep disturbance. The patient is not nervous/anxious.     PE;    03/15/2022    8:01 AM 12/20/2021    2:15 PM 11/18/2021    7:50 AM  Vitals with BMI  Height 5\' 3"  5' 2.4" 5' 2.5"  Weight 199 lbs 10 oz 195 lbs 195 lbs  BMI 35.37 35.21 35.08  Systolic 118 136  Diastolic 82 78 82  Pulse 72 70 70   Exam chaperoned by 818, CMA.  Gen: Alert, well appearing.  Patient is oriented to person, place, time, and situation. AFFECT: pleasant, lucid thought and speech. ENT: Ears: EACs clear, normal epithelium.  TMs with good light reflex and landmarks bilaterally.  Eyes: no injection, icteris, swelling, or exudate.  EOMI, PERRLA. Nose: no drainage or turbinate edema/swelling.  No injection or focal lesion.  Mouth: lips without lesion/swelling.  Oral mucosa pink and moist.  Dentition intact and without obvious caries or gingival swelling.  Oropharynx without erythema, exudate, or swelling.  Neck: supple/nontender.  No LAD, mass, or TM.  Carotid pulses 2+ bilaterally, without bruits. CV: RRR, no m/r/g.   LUNGS: CTA bilat, nonlabored resps, good aeration in all lung fields. ABD: soft, NT, ND, BS normal.  No hepatospenomegaly or mass.  No bruits. EXT: no clubbing, cyanosis, or edema.  Musculoskeletal: no joint swelling, erythema, warmth, or tenderness.  ROM of all joints intact. Skin - no sores or suspicious lesions or rashes or color changes  Pertinent labs:  Lab Results  Component Value Date   TSH 1.30 11/17/2020   Lab Results  Component Value Date   WBC 8.2 11/17/2020   HGB 13.8 11/17/2020   HCT 42.5 11/17/2020   MCV 90.2 11/17/2020    PLT 271 11/17/2020  Last vitamin D Lab Results  Component Value Date   VD25OH 41.9 11/10/2017   Lab Results  Component Value Date   CREATININE 0.99 08/06/2021   BUN 19 08/06/2021   NA 139 08/06/2021   K 4.1 08/06/2021   CL 101 08/06/2021   CO2 30 08/06/2021   Lab Results  Component Value Date   ALT 11 11/17/2020   AST 14 11/17/2020   ALKPHOS 43 (L) 11/13/2019   BILITOT 0.6 11/17/2020   Lab Results  Component Value Date   CHOL 199 11/17/2020   Lab Results  Component Value Date   HDL 72 11/17/2020   Lab Results  Component Value Date   LDLCALC 110 (H) 11/17/2020   Lab Results  Component Value Date   TRIG 78 11/17/2020   Lab Results  Component Value Date   CHOLHDL 2.8 11/17/2020   ASSESSMENT AND PLAN:   Health maintenance exam: Reviewed age and gender appropriate health maintenance issues (prudent diet, regular exercise, health risks of tobacco and excessive alcohol, use of seatbelts, fire alarms in home, use of sunscreen).  Also reviewed age and gender appropriate health screening as well as vaccine recommendations. Vaccines: ALL UTD Labs: fasting HP Cervical ca screening: UTD via GYN 11/2021 Breast ca screening: UTD via GYN 12/2021 Colon ca screening: recall 2029  An After Visit Summary was printed and given to the patient.  FOLLOW UP:  Return in about 6 months (around 09/14/2022) for routine chronic illness f/u.  Signed:  11/14/2022, MD           03/15/2022

## 2022-09-14 ENCOUNTER — Ambulatory Visit: Payer: BC Managed Care – PPO | Admitting: Family Medicine

## 2022-09-14 ENCOUNTER — Encounter: Payer: Self-pay | Admitting: Family Medicine

## 2022-09-14 VITALS — BP 130/83 | HR 67 | Ht 63.0 in | Wt 203.6 lb

## 2022-09-14 DIAGNOSIS — I10 Essential (primary) hypertension: Secondary | ICD-10-CM

## 2022-09-14 NOTE — Progress Notes (Signed)
OFFICE VISIT  09/14/2022  CC:  Chief Complaint  Patient presents with   Medical Management of Chronic Issues    Patient is a 57 y.o. female who presents for 52-month follow-up hypertension.  INTERIM HX: Ann West is feeling well. Home blood pressures are consistently in the 120s over 70s.   Past Medical History:  Diagnosis Date   Dyshidrotic dermatitis    Essential hypertension    Frequent headaches    Heart murmur    Pt states started after having scarlet fever as a child, "comes and goes" since then.  Has been evaluated by cardiologist--no premeds needed for surgery/procedures    Past Surgical History:  Procedure Laterality Date   COLONOSCOPY  12/26/2017   NORMAL. recall 2029.   CYST EXCISION  2002   benign cyst removed from Rt. underarm   HERNIA REPAIR     Bilat, inguinal, 1973 and 74.   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    Outpatient Medications Prior to Visit  Medication Sig Dispense Refill   Calcium Carbonate-Vit D-Min (CALCIUM 1200 PO) Take by mouth daily.     Cholecalciferol (VITAMIN D3) 1000 units CAPS Take 1,000 Units by mouth daily.     Ferrous Sulfate (IRON) 325 (65 Fe) MG TABS Take 325 mg by mouth daily.      hydrochlorothiazide (HYDRODIURIL) 25 MG tablet Take 1 tablet (25 mg total) by mouth daily. 90 tablet 1   ibuprofen (ADVIL,MOTRIN) 200 MG tablet Take 200 mg by mouth every 6 (six) hours as needed for headache (Takes 2-4 tablets prn headache).     Potassium 99 MG TABS 1 tablet at bedtime.     No facility-administered medications prior to visit.    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  Patient is oriented to person, place, time, and situation. No further exam today  LABS:  Last CBC Lab Results  Component Value Date   WBC 5.6 03/15/2022   HGB 14.0 03/15/2022   HCT 41.2 03/15/2022   MCV 88.1 03/15/2022   MCH 29.3 11/17/2020   RDW 14.0 03/15/2022   PLT 240.0 03/15/2022   Last metabolic panel Lab Results  Component Value Date   GLUCOSE 101 (H) 03/15/2022   NA 141 03/15/2022   K 4.5 03/15/2022   CL 102 03/15/2022   CO2 31 03/15/2022   BUN 17 03/15/2022   CREATININE 1.08 03/15/2022   GFRNONAA 73 11/13/2019   CALCIUM 9.5 03/15/2022   PROT 7.1 03/15/2022   ALBUMIN 4.6 03/15/2022   LABGLOB 2.5 11/13/2019   AGRATIO 1.9 11/13/2019   BILITOT 0.5 03/15/2022   ALKPHOS 35 (L) 03/15/2022   AST 17 03/15/2022   ALT 14 03/15/2022   Last lipids Lab Results  Component Value Date   CHOL 196 03/15/2022   HDL 70.00 03/15/2022   LDLCALC 106 (H) 03/15/2022  TRIG 102.0 03/15/2022   CHOLHDL 3 03/15/2022   Last thyroid functions Lab Results  Component Value Date   TSH 0.90 03/15/2022   Last vitamin D Lab Results  Component Value Date   VD25OH 41.9 11/10/2017   IMPRESSION AND PLAN:  Essential hypertension, well-controlled on HCTZ 25 mg a day. Continue this. Electrolytes and creatinine today.  An After Visit Summary was printed and given to the patient.  FOLLOW UP: Return in about 6 months (around 03/16/2023) for annual CPE (fasting).  Signed:  Santiago Bumpers, MD           09/14/2022

## 2022-09-15 LAB — BASIC METABOLIC PANEL
BUN: 14 mg/dL (ref 6–23)
CO2: 23 mEq/L (ref 19–32)
Calcium: 9.4 mg/dL (ref 8.4–10.5)
Chloride: 104 mEq/L (ref 96–112)
Creatinine, Ser: 0.94 mg/dL (ref 0.40–1.20)
GFR: 68.53 mL/min (ref >60.00)
Glucose, Bld: 100 mg/dL — ABNORMAL HIGH (ref 70–99)
Potassium: 4.3 mEq/L (ref 3.5–5.1)
Sodium: 141 mEq/L (ref 135–145)

## 2022-11-07 NOTE — Progress Notes (Signed)
57 y.o. G51P1001 [name] here for annual exam.    Sporadic menses.  Having hot flashes.  Manageable.   Mom moved down from [place].  Son going off to school.  PCP:   Dr. Milinda Cave  Patient's last menstrual period was 08/29/2022 (exact date).           Sexually active: Yes.    The current method of family planning is perimenopause.   withdrawal Exercising: Yes.     occ Smoker:  no  Health Maintenance: Pap:  11/18/21 neg: HR HPV neg, 11/12/18 neg; HR HPV neg.  Cervical biopsy last year benign.  Done for raised white area on cervix.  History of abnormal Pap:  no MMG:  01/05/22 Breast Density Cat c, BI-RADS CAT 1 neg Colonoscopy:  12/26/17 - due in 2029 BMD:   n/a  Result  n/a TDaP:  11/12/18 Gardasil:   no HIV: donated blood Hep C: donated blood Screening Labs:  PCP   reports that she has never smoked. She has never used smokeless tobacco. She reports current alcohol use of about 3.0 standard drinks of alcohol per week. She reports that she does not use drugs.  Past Medical History:  Diagnosis Date   Dyshidrotic dermatitis    Essential hypertension    Frequent headaches    Heart murmur    Pt states started after having scarlet fever as a child, "comes and goes" since then.  Has been evaluated by cardiologist--no premeds needed for surgery/procedures    Past Surgical History:  Procedure Laterality Date   COLONOSCOPY  12/26/2017   NORMAL. recall 2029.   CYST EXCISION  2002   benign cyst removed from Rt. underarm   HERNIA REPAIR     Bilat, inguinal, 1973 and 74.   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    Current Outpatient Medications  Medication Sig Dispense Refill   Calcium Carbonate-Vit D-Min (CALCIUM 1200 PO) Take by mouth daily.     Cholecalciferol (VITAMIN D3) 1000 units CAPS Take 1,000 Units by mouth daily.     Ferrous Sulfate (IRON) 325 (65 Fe) MG TABS Take 325 mg by mouth daily.      hydrochlorothiazide (HYDRODIURIL) 25 MG  tablet TAKE 1 TABLET (25 MG TOTAL) BY MOUTH DAILY. 90 tablet 1   ibuprofen (ADVIL,MOTRIN) 200 MG tablet Take 200 mg by mouth every 6 (six) hours as needed for headache (Takes 2-4 tablets prn headache).     Potassium 99 MG TABS 1 tablet at bedtime.     No current facility-administered medications for this visit.    Family History  Problem Relation Age of Onset   Hypertension Mother    Thyroid disease Mother    Arthritis Mother    Heart attack Father        Dec age 50   Hypertension Father    Hyperlipidemia Father    Early death Father        age 59   Heart attack Brother    Pulmonary Hypertension Brother    Early death Brother    Heart disease Brother    Learning disabilities Brother    Hypertension Maternal Grandmother    Arthritis Maternal Grandmother    Cancer Maternal Grandmother    Hypertension Maternal Grandfather    Alcohol abuse Maternal Grandfather    Heart attack Maternal Grandfather    Arthritis Maternal Grandfather    Hypertension Paternal Grandmother    Cancer Paternal Grandmother    Hearing  loss Paternal Grandmother    Arthritis Paternal Grandmother    Hypertension Paternal Grandfather    Arthritis Paternal Grandfather    COPD Paternal Grandfather    Heart attack Paternal Grandfather    Healthy Son    Colon cancer Neg Hx    Esophageal cancer Neg Hx    Rectal cancer Neg Hx    Stomach cancer Neg Hx    Colon polyps Neg Hx    Breast cancer Neg Hx     Review of Systems  All other systems reviewed and are negative.   Exam:   BP 120/82   Pulse 93   Ht 5' 1.5" (1.562 m)   Wt 199 lb (90.3 kg)   LMP 08/29/2022 (Exact Date)   SpO2 99%   BMI 36.99 kg/m     General appearance: alert, cooperative and appears stated age Head: normocephalic, without obvious abnormality, atraumatic Neck: no adenopathy, supple, symmetrical, trachea midline and thyroid normal to inspection and palpation Lungs: clear to auscultation bilaterally Breasts: normal appearance, no  masses or tenderness, No nipple retraction or dimpling, No nipple discharge or bleeding, No axillary adenopathy Heart: regular rate and rhythm Abdomen: soft, non-tender; no masses, no organomegaly Extremities: extremities normal, atraumatic, no cyanosis or edema Skin: skin color, texture, turgor normal. No rashes or lesions Lymph nodes: cervical, supraclavicular, and axillary nodes normal. Neurologic: grossly normal  Pelvic: External genitalia:  no lesions              No abnormal inguinal nodes palpated.              Urethra:  normal appearing urethra with no masses, tenderness or lesions              Bartholins and Skenes: normal                 Vagina: normal appearing vagina with normal color and discharge, no lesions.  First degree rectocele.               Cervix: no lesions              Pap taken: no Bimanual Exam:  Uterus:  normal size, contour, position, consistency, mobility, non-tender              Adnexa: no mass, fullness, tenderness              Rectal exam: yes.  Confirms.              Anus:  normal sphincter tone, no lesions  Chaperone was present for exam:  declined  Assessment:   Well woman visit with gynecologic exam. Perimenopausal female.  First degree rectocele.  HTN.  Plan: Mammogram screening discussed. Self breast awareness reviewed. Pap and HR HPV 2028. Guidelines for Calcium, Vitamin D, regular exercise program including cardiovascular and weight bearing exercise.   Follow up annually and prn.

## 2022-11-08 ENCOUNTER — Other Ambulatory Visit: Payer: Self-pay | Admitting: Family Medicine

## 2022-11-21 ENCOUNTER — Encounter: Payer: Self-pay | Admitting: Obstetrics and Gynecology

## 2022-11-21 ENCOUNTER — Ambulatory Visit: Payer: BC Managed Care – PPO | Admitting: Obstetrics and Gynecology

## 2022-11-21 VITALS — BP 120/82 | HR 93 | Ht 61.5 in | Wt 199.0 lb

## 2022-11-21 DIAGNOSIS — Z01419 Encounter for gynecological examination (general) (routine) without abnormal findings: Secondary | ICD-10-CM

## 2022-11-21 NOTE — Patient Instructions (Signed)

## 2022-12-13 ENCOUNTER — Other Ambulatory Visit: Payer: Self-pay | Admitting: Obstetrics and Gynecology

## 2022-12-13 DIAGNOSIS — Z1231 Encounter for screening mammogram for malignant neoplasm of breast: Secondary | ICD-10-CM

## 2023-01-09 ENCOUNTER — Ambulatory Visit
Admission: RE | Admit: 2023-01-09 | Discharge: 2023-01-09 | Disposition: A | Discharge status: Home / Self Care | Payer: BC Managed Care – PPO | Source: Ambulatory Visit

## 2023-01-09 DIAGNOSIS — Z1231 Encounter for screening mammogram for malignant neoplasm of breast: Secondary | ICD-10-CM

## 2023-03-21 ENCOUNTER — Encounter: Payer: Self-pay | Admitting: Family Medicine

## 2023-03-21 ENCOUNTER — Ambulatory Visit: Payer: BC Managed Care – PPO | Admitting: Family Medicine

## 2023-03-21 VITALS — BP 111/74 | HR 70 | Temp 98.5°F | Ht 63.0 in | Wt 203.0 lb

## 2023-03-21 DIAGNOSIS — Z0000 Encounter for general adult medical examination without abnormal findings: Secondary | ICD-10-CM | POA: Diagnosis not present

## 2023-03-21 DIAGNOSIS — I10 Essential (primary) hypertension: Secondary | ICD-10-CM | POA: Diagnosis not present

## 2023-03-21 LAB — CBC
HCT: 41.6 % (ref 36.0–46.0)
Hemoglobin: 13.4 g/dL (ref 12.0–15.0)
MCHC: 32.3 g/dL (ref 30.0–36.0)
MCV: 90.3 fL (ref 78.0–100.0)
Platelets: 259 10*3/uL (ref 150.0–400.0)
RBC: 4.61 Mil/uL (ref 3.87–5.11)
RDW: 14.7 % (ref 11.5–15.5)
WBC: 5.2 10*3/uL (ref 4.0–10.5)

## 2023-03-21 LAB — LIPID PANEL
Cholesterol: 218 mg/dL — ABNORMAL HIGH (ref 0–200)
HDL: 70.5 mg/dL (ref >39.00)
LDL Cholesterol: 131 mg/dL — ABNORMAL HIGH (ref 0–99)
NonHDL: 147.78
Total CHOL/HDL Ratio: 3
Triglycerides: 83 mg/dL (ref 0.0–149.0)
VLDL: 16.6 mg/dL (ref 0.0–40.0)

## 2023-03-21 LAB — COMPREHENSIVE METABOLIC PANEL
ALT: 12 U/L (ref 0–35)
AST: 15 U/L (ref 0–37)
Albumin: 4.5 g/dL (ref 3.5–5.2)
Alkaline Phosphatase: 39 U/L (ref 39–117)
BUN: 23 mg/dL (ref 6–23)
CO2: 31 meq/L (ref 19–32)
Calcium: 9.6 mg/dL (ref 8.4–10.5)
Chloride: 103 meq/L (ref 96–112)
Creatinine, Ser: 0.93 mg/dL (ref 0.40–1.20)
GFR: 69.16 mL/min (ref >60.00)
Glucose, Bld: 100 mg/dL — ABNORMAL HIGH (ref 70–99)
Potassium: 4.3 meq/L (ref 3.5–5.1)
Sodium: 142 meq/L (ref 135–145)
Total Bilirubin: 0.5 mg/dL (ref 0.2–1.2)
Total Protein: 7.1 g/dL (ref 6.0–8.3)

## 2023-03-21 LAB — TSH: TSH: 0.89 u[IU]/mL (ref 0.35–5.50)

## 2023-03-21 LAB — HEMOGLOBIN A1C: Hgb A1c MFr Bld: 5.8 % (ref 4.6–6.5)

## 2023-03-21 NOTE — Progress Notes (Signed)
Office Note 03/21/2023  CC:  Chief Complaint  Patient presents with   Annual Exam    Pt is fasting    HPI:  Patient is a 57 y.o. female who is here for annual health maintenance exam and 91-month follow-up hypertension.  Hypertension was well-controlled on HCTZ 25 mg a day at her last visit.  Valisa is feeling well. Monitoring blood pressure infrequently at home but every time she checks it it is less than 130/80.  No new concerns..  Past Medical History:  Diagnosis Date   Dyshidrotic dermatitis    Essential hypertension    Frequent headaches    Heart murmur    Pt states started after having scarlet fever as a child, "comes and goes" since then.  Has been evaluated by cardiologist--no premeds needed for surgery/procedures    Past Surgical History:  Procedure Laterality Date   COLONOSCOPY  12/26/2017   NORMAL. recall 2029.   CYST EXCISION  2002   benign cyst removed from Rt. underarm   HERNIA REPAIR     Bilat, inguinal, 1973 and 76.   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    Family History  Problem Relation Age of Onset   Hypertension Mother    Thyroid disease Mother    Arthritis Mother    Heart attack Father        Dec age 82   Hypertension Father    Hyperlipidemia Father    Early death Father        age 31   Heart attack Brother    Pulmonary Hypertension Brother    Early death Brother    Heart disease Brother    Learning disabilities Brother    Hypertension Maternal Grandmother    Arthritis Maternal Grandmother    Cancer Maternal Grandmother    Hypertension Maternal Grandfather    Alcohol abuse Maternal Grandfather    Heart attack Maternal Grandfather    Arthritis Maternal Grandfather    Hypertension Paternal Grandmother    Cancer Paternal Grandmother    Hearing loss Paternal Grandmother    Arthritis Paternal Grandmother    Hypertension Paternal Grandfather    Arthritis Paternal Grandfather    COPD Paternal  Grandfather    Heart attack Paternal Grandfather    Healthy Son    Colon cancer Neg Hx    Esophageal cancer Neg Hx    Rectal cancer Neg Hx    Stomach cancer Neg Hx    Colon polyps Neg Hx    Breast cancer Neg Hx     Social History   Socioeconomic History   Marital status: Married    Spouse name: Not on file   Number of children: Not on file   Years of education: Not on file   Highest education level: Master's degree (e.g., MA, MS, MEng, MEd, MSW, MBA)  Occupational History   Not on file  Tobacco Use   Smoking status: Never   Smokeless tobacco: Never  Vaping Use   Vaping status: Never Used  Substance and Sexual Activity   Alcohol use: Yes    Alcohol/week: 3.0 standard drinks of alcohol    Types: 3 Standard drinks or equivalent per week   Drug use: No   Sexual activity: Yes    Partners: Male    Comment: withdrawal  Other Topics Concern   Not on file  Social History Narrative   Married, 1 son.   Originally from [place] near [place].   Relocated  to [facility] approximately 2005.   Masters in education.   High school Barrister's clerk, [employer].   Occasional alcohol.  No tobacco or drugs.   Social Determinants of Health   Financial Resource Strain: Low Risk  (07/22/2021)   Overall Financial Resource Strain (CARDIA)    Difficulty of Paying Living Expenses: Not hard at all  Food Insecurity: No Food Insecurity (07/22/2021)   Hunger Vital Sign    Worried About Running Out of Food in the Last Year: Never true    Ran Out of Food in the Last Year: Never true  Transportation Needs: No Transportation Needs (07/22/2021)   PRAPARE - Administrator, Civil Service (Medical): No    Lack of Transportation (Non-Medical): No  Physical Activity: Insufficiently Active (07/22/2021)   Exercise Vital Sign    Days of Exercise per Week: 1 day    Minutes of Exercise per Session: 30 min  Stress: Stress Concern Present (07/22/2021)   [employer] of  Occupational Health - Occupational Stress Questionnaire    Feeling of Stress : To some extent  Social Connections: Socially Integrated (07/22/2021)   Social Connection and Isolation Panel [NHANES]    Frequency of Communication with Friends and Family: More than three times a week    Frequency of Social Gatherings with Friends and Family: Once a week    Attends Religious Services: More than 4 times per year    Active Member of [employer] or Organizations: Yes    Attends Engineer, structural: More than 4 times per year    Marital Status: Married  Catering manager Violence: Not on file    Outpatient Medications Prior to Visit  Medication Sig Dispense Refill   Calcium Carbonate-Vit D-Min (CALCIUM 1200 PO) Take by mouth daily.     Cholecalciferol (VITAMIN D3) 1000 units CAPS Take 1,000 Units by mouth daily.     Ferrous Sulfate (IRON) 325 (65 Fe) MG TABS Take 325 mg by mouth daily.      hydrochlorothiazide (HYDRODIURIL) 25 MG tablet TAKE 1 TABLET (25 MG TOTAL) BY MOUTH DAILY. 90 tablet 1   ibuprofen (ADVIL,MOTRIN) 200 MG tablet Take 200 mg by mouth every 6 (six) hours as needed for headache (Takes 2-4 tablets prn headache).     Potassium 99 MG TABS 1 tablet at bedtime.     No facility-administered medications prior to visit.    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  HENT:  Negative for congestion, dental problem, ear pain and sore throat.   Eyes:  Negative for discharge, redness and visual disturbance.  Respiratory:  Negative for cough, chest tightness, shortness of breath and wheezing.   Cardiovascular:  Negative for chest pain, palpitations and leg swelling.  Gastrointestinal:  Negative for abdominal pain, blood in stool, diarrhea, nausea and  vomiting.  Genitourinary:  Negative for difficulty urinating, dysuria, flank pain, frequency, hematuria and urgency.  Musculoskeletal:  Negative for arthralgias, back pain, joint swelling, myalgias and neck stiffness.  Skin:  Negative for pallor and rash.  Neurological:  Negative for dizziness, speech difficulty, weakness and headaches.  Hematological:  Negative for adenopathy. Does not bruise/bleed easily.  Psychiatric/Behavioral:  Negative for confusion and sleep disturbance. The patient is not nervous/anxious.     PE;  03/21/2023    7:55 AM 03/21/2023    7:53 AM 11/21/2022    2:15 PM  Vitals with BMI  Height  5\' 3"  5' 1.5"  Weight  203 lbs 199 lbs  BMI  35.97 37  Systolic 111 129 960  Diastolic 74 90 82  Pulse  70 93  Exam chaperoned by Harlene Salts, CMA  Gen: Alert, well appearing.  Patient is oriented to person, place, time, and situation. AFFECT: pleasant, lucid thought and speech. ENT: Ears: EACs clear, normal epithelium.  TMs with good light reflex and landmarks bilaterally.  Eyes: no injection, icteris, swelling, or exudate.  EOMI, PERRLA. Nose: no drainage or turbinate edema/swelling.  No injection or focal lesion.  Mouth: lips without lesion/swelling.  Oral mucosa pink and moist.  Dentition intact and without obvious caries or gingival swelling.  Oropharynx without erythema, exudate, or swelling.  Neck: supple/nontender.  No LAD, mass, or TM.  Carotid pulses 2+ bilaterally, without bruits. CV: RRR, no m/r/g.   LUNGS: CTA bilat, nonlabored resps, good aeration in all lung fields. ABD: soft, NT, ND, BS normal.  No hepatospenomegaly or mass.  No bruits. EXT: no clubbing, cyanosis, or edema.  Musculoskeletal: no joint swelling, erythema, warmth, or tenderness.  ROM of all joints intact. Skin - no sores or suspicious lesions or rashes or color changes  Pertinent labs:  Lab Results  Component Value Date   TSH 0.90 03/15/2022   Lab Results  Component Value Date   WBC  5.6 03/15/2022   HGB 14.0 03/15/2022   HCT 41.2 03/15/2022   MCV 88.1 03/15/2022   PLT 240.0 03/15/2022   Lab Results  Component Value Date   CREATININE 0.94 09/14/2022   BUN 14 09/14/2022   NA 141 09/14/2022   K 4.3 09/14/2022   CL 104 09/14/2022   CO2 23 09/14/2022   Lab Results  Component Value Date   ALT 14 03/15/2022   AST 17 03/15/2022   ALKPHOS 35 (L) 03/15/2022   BILITOT 0.5 03/15/2022   Lab Results  Component Value Date   CHOL 196 03/15/2022   Lab Results  Component Value Date   HDL 70.00 03/15/2022   Lab Results  Component Value Date   LDLCALC 106 (H) 03/15/2022   Lab Results  Component Value Date   TRIG 102.0 03/15/2022   Lab Results  Component Value Date   CHOLHDL 3 03/15/2022   ASSESSMENT AND PLAN:   #1 health maintenance exam: Reviewed age and gender appropriate health maintenance issues (prudent diet, regular exercise, health risks of tobacco and excessive alcohol, use of seatbelts, fire alarms in home, use of sunscreen).  Also reviewed age and gender appropriate health screening as well as vaccine recommendations. Vaccines: ALL UTD Labs: fasting HP Cervical ca screening: UTD via GYN 11/2021-->due again 2028. Breast ca screening: UTD via GYN  Colon ca screening: recall 2029  #2 hypertension, well-controlled on HCTZ 25 mg a day. Electrolytes and creatinine today.  An After Visit Summary was printed and given to the patient.  FOLLOW UP:  Return in about 6 months (around 09/19/2023) for routine chronic illness f/u.  Signed:  Santiago Bumpers, MD           03/21/2023

## 2023-03-21 NOTE — Patient Instructions (Signed)

## 2023-05-13 ENCOUNTER — Other Ambulatory Visit: Payer: Self-pay | Admitting: Family Medicine

## 2023-09-19 ENCOUNTER — Encounter: Payer: Self-pay | Admitting: Family Medicine

## 2023-09-19 ENCOUNTER — Ambulatory Visit: Payer: BC Managed Care – PPO | Admitting: Family Medicine

## 2023-09-19 VITALS — BP 129/86 | HR 71 | Temp 99.7°F | Ht 63.0 in | Wt 207.6 lb

## 2023-09-19 DIAGNOSIS — E7800 Pure hypercholesterolemia, unspecified: Secondary | ICD-10-CM

## 2023-09-19 DIAGNOSIS — I10 Essential (primary) hypertension: Secondary | ICD-10-CM | POA: Diagnosis not present

## 2023-09-19 MED ORDER — HYDROCHLOROTHIAZIDE 25 MG PO TABS: 25.0000 mg | ORAL_TABLET | Freq: Every day | ORAL | 1 refills | Status: DC

## 2023-09-19 NOTE — Progress Notes (Signed)
 OFFICE VISIT  09/19/2023  CC:  Chief Complaint  Patient presents with   Medical Management of Chronic Issues    Pt is fasting    Patient is a 58 y.o. female who presents for 54-month follow-up hypertension. At the time of last follow-up her blood pressure was well-controlled on HCTZ 25 mg a day.  INTERIM HX: Feeling well, no acute concerns. Home bp consistently <130/80.   Past Medical History:  Diagnosis Date   Dyshidrotic dermatitis    Essential hypertension    Frequent headaches    Heart murmur    Pt states started after having scarlet fever as a child, "comes and goes" since then.  Has been evaluated by cardiologist--no premeds needed for surgery/procedures    Past Surgical History:  Procedure Laterality Date   COLONOSCOPY  12/26/2017   NORMAL. recall 2029.   CYST EXCISION  2002   benign cyst removed from Rt. underarm   HERNIA REPAIR     Bilat, inguinal, 1973 and 74.   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    Outpatient Medications Prior to Visit  Medication Sig Dispense Refill   Calcium Carbonate-Vit D-Min (CALCIUM 1200 PO) Take by mouth daily.     Cholecalciferol (VITAMIN D3) 1000 units CAPS Take 1,000 Units by mouth daily.     Ferrous Sulfate (IRON) 325 (65 Fe) MG TABS Take 325 mg by mouth daily.      ibuprofen (ADVIL,MOTRIN) 200 MG tablet Take 200 mg by mouth every 6 (six) hours as needed for headache (Takes 2-4 tablets prn headache).     Potassium 99 MG TABS 1 tablet at bedtime.     hydrochlorothiazide  (HYDRODIURIL ) 25 MG tablet TAKE 1 TABLET (25 MG TOTAL) BY MOUTH DAILY. 90 tablet 1   No facility-administered medications prior to visit.    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  Patient is oriented to person, place, time, and situation. AFFECT: pleasant, lucid thought and speech. CV: RRR, no m/r/g.   LUNGS: CTA bilat, nonlabored resps, good aeration in all lung fields. EXT: no clubbing or cyanosis.  no edema.    LABS:  Last CBC Lab Results  Component Value Date   WBC 5.2 03/21/2023   HGB 13.4 03/21/2023   HCT 41.6 03/21/2023   MCV 90.3 03/21/2023   MCH 29.3 11/17/2020   RDW 14.7 03/21/2023   PLT 259.0 03/21/2023   Last metabolic panel Lab Results  Component Value Date   GLUCOSE 100 (H) 03/21/2023   NA 142 03/21/2023   K 4.3 03/21/2023   CL 103 03/21/2023   CO2 31 03/21/2023   BUN 23 03/21/2023   CREATININE 0.93 03/21/2023   GFR 69.16 03/21/2023   CALCIUM 9.6 03/21/2023   PROT 7.1 03/21/2023   ALBUMIN 4.5 03/21/2023   LABGLOB 2.5 11/13/2019   AGRATIO 1.9 11/13/2019   BILITOT 0.5  03/21/2023   ALKPHOS 39 03/21/2023   AST 15 03/21/2023   ALT 12 03/21/2023   Last lipids Lab Results  Component Value Date   CHOL 218 (H) 03/21/2023   HDL 70.50 03/21/2023   LDLCALC 131 (H) 03/21/2023   TRIG 83.0 03/21/2023   CHOLHDL 3 03/21/2023   Last hemoglobin A1c Lab Results  Component Value Date   HGBA1C 5.8 03/21/2023   Last thyroid  functions Lab Results  Component Value Date   TSH 0.89 03/21/2023   IMPRESSION AND PLAN:  #1 hypertension, well-controlled on HCTZ 25 mg a day. Monitor electrolytes and creatinine today.  #2 hypercholesterolemia. We discussed getting on a statin today and she is open to this. We will repeat lipid panel today and if LDL is not less than 100 we will start atorvastatin 20 mg a day and repeat lipids again in 3 months.  An After Visit Summary was printed and given to the patient.  FOLLOW UP: Return in about 6  months (around 03/20/2024) for annual CPE (fasting).  Signed:  Arletha Lady, MD           09/19/2023

## 2023-09-20 ENCOUNTER — Encounter: Payer: Self-pay | Admitting: Family Medicine

## 2023-09-20 ENCOUNTER — Ambulatory Visit: Payer: Self-pay | Admitting: Family Medicine

## 2023-09-20 DIAGNOSIS — E7800 Pure hypercholesterolemia, unspecified: Secondary | ICD-10-CM

## 2023-09-20 LAB — BASIC METABOLIC PANEL WITH GFR
BUN: 17 mg/dL (ref 7–25)
CO2: 30 mmol/L (ref 20–32)
Calcium: 10 mg/dL (ref 8.6–10.4)
Chloride: 103 mmol/L (ref 98–110)
Creat: 1.03 mg/dL (ref 0.50–1.03)
Glucose, Bld: 104 mg/dL — ABNORMAL HIGH (ref 65–99)
Potassium: 4.6 mmol/L (ref 3.5–5.3)
Sodium: 140 mmol/L (ref 135–146)
eGFR: 64 mL/min/{1.73_m2} (ref >=60)

## 2023-09-20 LAB — LIPID PANEL
Cholesterol: 193 mg/dL (ref <200)
HDL: 70 mg/dL (ref >=50)
LDL Cholesterol (Calc): 108 mg/dL — ABNORMAL HIGH
Non-HDL Cholesterol (Calc): 123 mg/dL (ref <130)
Total CHOL/HDL Ratio: 2.8 (calc) (ref <5.0)
Triglycerides: 63 mg/dL (ref <150)

## 2023-09-20 MED ORDER — ATORVASTATIN CALCIUM 20 MG PO TABS: 20.0000 mg | ORAL_TABLET | Freq: Every day | ORAL | 2 refills | Status: DC

## 2023-11-22 ENCOUNTER — Encounter: Payer: Self-pay | Admitting: Obstetrics and Gynecology

## 2023-11-22 ENCOUNTER — Ambulatory Visit (INDEPENDENT_AMBULATORY_CARE_PROVIDER_SITE_OTHER): Payer: BC Managed Care – PPO | Admitting: Obstetrics and Gynecology

## 2023-11-22 VITALS — BP 128/82 | HR 76 | Ht 62.0 in | Wt 202.0 lb

## 2023-11-22 DIAGNOSIS — Z01419 Encounter for gynecological examination (general) (routine) without abnormal findings: Secondary | ICD-10-CM

## 2023-11-22 DIAGNOSIS — Z1331 Encounter for screening for depression: Secondary | ICD-10-CM

## 2023-11-22 DIAGNOSIS — N951 Menopausal and female climacteric states: Secondary | ICD-10-CM

## 2023-11-22 LAB — FOLLICLE STIMULATING HORMONE: FSH: 101.7 m[IU]/mL

## 2023-11-22 LAB — ESTRADIOL: Estradiol: 15 pg/mL

## 2023-11-22 NOTE — Progress Notes (Signed)
 58 y.o. [id number] [name] here for annual exam.  Pt reports some symptoms of menopause.  Hot flashes but manageable.   Not seeking treatment options.   Would like to know that she does not need pregnancy prevention.  LMP was 08/29/22.   No bladder or bowel concerns.   Retiring in June.   PCP: Candise Aleene DEL, MD   Patient's last menstrual period was 08/29/2022 (approximate).           Sexually active: Yes.    The current method of family planning is none.    Menopausal hormone therapy:  n/a Exercising: Yes.  Walking. Smoker:  no  OB History  Gravida Para Term Preterm AB Living  1 1 1   1   SAB IAB Ectopic Multiple Live Births          # Outcome Date GA Lbr Len/2nd Weight Sex Type Anes PTL Lv  1 Term              HEALTH MAINTENANCE: Last 2 paps:  11/18/21 neg HR HPV neg, 11/12/18 neg HPV neg  History of abnormal Pap or positive HPV:  no Mammogram:   01/09/23 Breast Density Cat B, BIRADS Cat 1 neg.  Scheduled for 01/10/24.  Colonoscopy:  12/26/17 - due in 2029. Bone Density:  n/a  Result  n/a   Immunization History  Administered Date(s) Administered   Influenza,inj,Quad PF,6+ Mos 03/15/2022   Influenza-Unspecified 01/24/2021, 01/10/2023   PFIZER(Purple Top)SARS-COV-2 Vaccination 06/06/2019, 07/03/2019, 02/22/2020   Pfizer(Comirnaty)Fall Seasonal Vaccine 12 years and older 02/13/2022, 12/17/2022   Tdap 11/12/2018   Zoster Recombinant(Shingrix ) 11/12/2018, 01/19/2019      reports that she has never smoked. She has never used smokeless tobacco. She reports current alcohol use of about 3.0 standard drinks of alcohol per week. She reports that she does not use drugs.  Past Medical History:  Diagnosis Date   Dyshidrotic dermatitis    Essential hypertension    Frequent headaches    Heart murmur    Pt states started after having scarlet fever as a child, comes and goes since then.  Has been evaluated by cardiologist--no premeds needed for  surgery/procedures   Hypercholesterolemia    Statin started 09/2023    Past Surgical History:  Procedure Laterality Date   COLONOSCOPY  12/26/2017   NORMAL. recall 2029.   CYST EXCISION  2002   benign cyst removed from Rt. underarm   HERNIA REPAIR     Bilat, inguinal, 1973 and 74.   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    Current Outpatient Medications  Medication Sig Dispense Refill   ascorbic acid (VITAMIN C) 500 MG tablet      atorvastatin  (LIPITOR) 20 MG tablet Take 1 tablet (20 mg total) by mouth daily. 30 tablet 2   Calcium  Carbonate-Vit D-Min (CALCIUM  1200 PO) Take by mouth daily.     Cholecalciferol (VITAMIN D3) 1000 units CAPS Take 1,000 Units by mouth daily.     Ferrous Sulfate (IRON) 325 (65 Fe) MG TABS Take 325 mg by mouth daily.      hydrochlorothiazide  (HYDRODIURIL ) 25 MG tablet Take 1 tablet (25 mg total) by mouth daily. 90 tablet 1   ibuprofen (ADVIL,MOTRIN) 200 MG tablet Take 200 mg by mouth every 6 (six) hours as needed for headache (Takes 2-4 tablets prn headache).     Potassium 99 MG TABS 1 tablet at bedtime.     No current facility-administered medications for this  visit.    ALLERGIES: Penicillins, Vancomycin, Asa [aspirin], and Bactrim [sulfamethoxazole-trimethoprim]  Family History  Problem Relation Age of Onset   Hypertension Mother    Thyroid  disease Mother    Arthritis Mother    Heart attack Father        Dec age 39   Hypertension Father    Hyperlipidemia Father    Early death Father        age 107   Heart attack Brother    Pulmonary Hypertension Brother    Early death Brother    Heart disease Brother    Learning disabilities Brother    Hypertension Maternal Grandmother    Arthritis Maternal Grandmother    Cancer Maternal Grandmother    Hypertension Maternal Grandfather    Alcohol abuse Maternal Grandfather    Heart attack Maternal Grandfather    Arthritis Maternal Grandfather    Hypertension Paternal  Grandmother    Cancer Paternal Grandmother    Hearing loss Paternal Grandmother    Arthritis Paternal Grandmother    Hypertension Paternal Grandfather    Arthritis Paternal Grandfather    COPD Paternal Grandfather    Heart attack Paternal Grandfather    Healthy Son    Colon cancer Neg Hx    Esophageal cancer Neg Hx    Rectal cancer Neg Hx    Stomach cancer Neg Hx    Colon polyps Neg Hx    Breast cancer Neg Hx     Review of Systems  Genitourinary:  Negative for menstrual problem.    PHYSICAL EXAM:  BP 128/82 (BP Location: Left Arm, Patient Position: Sitting, Cuff Size: Normal)   Pulse 76   Ht 5' 2 (1.575 m)   Wt 202 lb (91.6 kg)   LMP 08/29/2022 (Approximate)   SpO2 98%   BMI 36.95 kg/m     General appearance: alert, cooperative and appears stated age Head: normocephalic, without obvious abnormality, atraumatic Neck: no adenopathy, supple, symmetrical, trachea midline and thyroid  normal to inspection and palpation Lungs: clear to auscultation bilaterally Breasts: normal appearance, no masses or tenderness, No nipple retraction or dimpling, No nipple discharge or bleeding, No axillary adenopathy Heart: regular rate and rhythm Abdomen: soft, non-tender; no masses, no organomegaly Extremities: extremities normal, atraumatic, no cyanosis or edema Skin: skin color, texture, turgor normal. No rashes or lesions Lymph nodes: cervical, supraclavicular, and axillary nodes normal. Neurologic: grossly normal  Pelvic: External genitalia:  no lesions              No abnormal inguinal nodes palpated.              Urethra:  normal appearing urethra with no masses, tenderness or lesions              Bartholins and Skenes: normal                 Vagina: normal appearing vagina with normal color and discharge, no lesions.  First degree rectocele.               Cervix: no lesions              Pap taken: no Bimanual Exam:  Uterus:  normal size, contour, position, consistency, mobility,  non-tender              Adnexa: no mass, fullness, tenderness              Rectal exam: yes.  Confirms.  Anus:  normal sphincter tone, no lesions  Chaperone was present for exam:  Kari HERO, CMA  ASSESSMENT: Well woman visit with gynecologic exam. Menopausal symptoms.  First degree rectocele.  HTN. PHQ-2-9: 0  PLAN: Mammogram screening discussed. Self breast awareness reviewed. Pap and HRV collected:  no.  Due in 2028 Guidelines for Calcium , Vitamin D , regular exercise program including cardiovascular and weight bearing exercise. Medication refills:  NA Will check FSH, estradiol , and AMH.   Follow up:  yearly and prn.

## 2023-11-22 NOTE — Patient Instructions (Signed)

## 2023-11-23 ENCOUNTER — Ambulatory Visit: Payer: Self-pay | Admitting: Obstetrics and Gynecology

## 2023-11-25 LAB — ANTI-MULLERIAN HORMONE (AMH), FEMALE: Anti-Mullerian Hormones(AMH), Female: 0.01 ng/mL

## 2023-12-16 ENCOUNTER — Other Ambulatory Visit: Payer: Self-pay | Admitting: Family Medicine

## 2023-12-16 DIAGNOSIS — E7800 Pure hypercholesterolemia, unspecified: Secondary | ICD-10-CM

## 2023-12-17 ENCOUNTER — Encounter: Payer: Self-pay | Admitting: Family Medicine

## 2023-12-17 DIAGNOSIS — E7800 Pure hypercholesterolemia, unspecified: Secondary | ICD-10-CM

## 2023-12-18 MED ORDER — ATORVASTATIN CALCIUM 20 MG PO TABS: 20.0000 mg | ORAL_TABLET | Freq: Every day | ORAL | 2 refills | Status: DC

## 2023-12-19 ENCOUNTER — Ambulatory Visit: Payer: Self-pay | Admitting: Family Medicine

## 2023-12-19 ENCOUNTER — Other Ambulatory Visit (INDEPENDENT_AMBULATORY_CARE_PROVIDER_SITE_OTHER)

## 2023-12-19 DIAGNOSIS — E7800 Pure hypercholesterolemia, unspecified: Secondary | ICD-10-CM | POA: Diagnosis not present

## 2023-12-19 LAB — LIPID PANEL
Cholesterol: 135 mg/dL (ref 0–200)
HDL: 63.3 mg/dL (ref >39.00)
LDL Cholesterol: 55 mg/dL (ref 0–99)
NonHDL: 71.21
Total CHOL/HDL Ratio: 2
Triglycerides: 79 mg/dL (ref 0.0–149.0)
VLDL: 15.8 mg/dL (ref 0.0–40.0)

## 2023-12-19 LAB — AST: AST: 17 U/L (ref 0–37)

## 2023-12-19 LAB — ALT: ALT: 17 U/L (ref 0–35)

## 2023-12-19 MED ORDER — ATORVASTATIN CALCIUM 20 MG PO TABS
20.0000 mg | ORAL_TABLET | Freq: Every day | ORAL | 3 refills | Status: AC
Start: 2023-12-19 — End: ?

## 2023-12-22 ENCOUNTER — Other Ambulatory Visit: Payer: Self-pay | Admitting: Family Medicine

## 2023-12-22 DIAGNOSIS — Z1231 Encounter for screening mammogram for malignant neoplasm of breast: Secondary | ICD-10-CM

## 2024-01-10 ENCOUNTER — Ambulatory Visit: Admission: RE | Admit: 2024-01-10 | Discharge: 2024-01-10 | Disposition: A | Source: Ambulatory Visit

## 2024-01-10 DIAGNOSIS — Z1231 Encounter for screening mammogram for malignant neoplasm of breast: Secondary | ICD-10-CM

## 2024-01-14 ENCOUNTER — Ambulatory Visit: Payer: Self-pay | Admitting: Obstetrics and Gynecology

## 2024-03-21 ENCOUNTER — Ambulatory Visit: Payer: Self-pay | Admitting: Family Medicine

## 2024-03-21 ENCOUNTER — Encounter: Payer: Self-pay | Admitting: Family Medicine

## 2024-03-21 VITALS — BP 111/72 | HR 77 | Temp 97.2°F | Ht 62.75 in | Wt 205.0 lb

## 2024-03-21 DIAGNOSIS — R7301 Impaired fasting glucose: Secondary | ICD-10-CM | POA: Diagnosis not present

## 2024-03-21 DIAGNOSIS — Z0000 Encounter for general adult medical examination without abnormal findings: Secondary | ICD-10-CM | POA: Diagnosis not present

## 2024-03-21 DIAGNOSIS — I10 Essential (primary) hypertension: Secondary | ICD-10-CM

## 2024-03-21 DIAGNOSIS — E7800 Pure hypercholesterolemia, unspecified: Secondary | ICD-10-CM | POA: Diagnosis not present

## 2024-03-21 NOTE — Patient Instructions (Signed)

## 2024-03-21 NOTE — Progress Notes (Signed)
 Office Note 03/21/2024  CC:  Chief Complaint  Patient presents with   Annual Exam    HPI:  Patient is a 58 y.o. female who is here for annual health maintenance exam and follow-up hypertension and hypercholesterolemia. Feeling well other than recent respiratory illness.  She started getting a fever and cough and general malaise about 5 days ago.  The fever resolved pretty quickly and her nasal/sinus and cough symptoms are improving.  She feels much better, like she is at the tail end of the illness.  Past Medical History:  Diagnosis Date   Allergy    Dyshidrotic dermatitis    Essential hypertension    Frequent headaches    Heart murmur    Pt states started after having scarlet fever as a child, comes and goes since then.  Has been evaluated by cardiologist--no premeds needed for surgery/procedures   Hypercholesterolemia    Statin started 09/2023   Sleep apnea    Husband notices it if I sleep on my back    Past Surgical History:  Procedure Laterality Date   COLONOSCOPY  12/26/2017   NORMAL. recall 2029.   CYST EXCISION  2002   benign cyst removed from Rt. underarm   HERNIA REPAIR     Bilat, inguinal, 1973 and 74.   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    Family History  Problem Relation Age of Onset   Hypertension Mother    Thyroid  disease Mother    Arthritis Mother    Heart attack Father        Dec age 56   Hypertension Father    Hyperlipidemia Father    Early death Father        age 58   Heart attack Brother    Pulmonary Hypertension Brother    Early death Brother    Heart disease Brother    Learning disabilities Brother    Hypertension Maternal Grandmother    Arthritis Maternal Grandmother    Cancer Maternal Grandmother    Hypertension Maternal Grandfather    Alcohol abuse Maternal Grandfather    Heart attack Maternal Grandfather    Arthritis Maternal Grandfather    Hypertension Paternal Grandmother    Cancer  Paternal Grandmother    Hearing loss Paternal Grandmother    Arthritis Paternal Grandmother    Hypertension Paternal Grandfather    Arthritis Paternal Grandfather    COPD Paternal Grandfather    Heart attack Paternal Grandfather    Healthy Son    Colon cancer Neg Hx    Esophageal cancer Neg Hx    Rectal cancer Neg Hx    Stomach cancer Neg Hx    Colon polyps Neg Hx    Breast cancer Neg Hx     Social History   Socioeconomic History   Marital status: Married    Spouse name: Not on file   Number of children: Not on file   Years of education: Not on file   Highest education level: Master's degree (e.g., MA, MS, MEng, MEd, MSW, MBA)  Occupational History   Not on file  Tobacco Use   Smoking status: Never   Smokeless tobacco: Never  Vaping Use   Vaping status: Never Used  Substance and Sexual Activity   Alcohol use: Yes    Alcohol/week: 3.0 standard drinks of alcohol    Types: 3 Standard drinks or equivalent per week   Drug use: No   Sexual activity: Yes    Partners: Male  Comment: withdrawal  Other Topics Concern   Not on file  Social History Narrative   Married, 1 son.   Originally from [place]  near [place].   Relocated to [place]  approximately 2005.   Masters in education.   High school Barrister's clerk, [employer].   Occasional alcohol.  No tobacco or drugs.   Social Drivers of Health   Tobacco Use: Low Risk (03/21/2024)   Patient History    Smoking Tobacco Use: Never    Smokeless Tobacco Use: Never    Passive Exposure: Not on file  Financial Resource Strain: Low Risk (07/22/2021)   Overall Financial Resource Strain (CARDIA)    Difficulty of Paying Living Expenses: Not hard at all  Food Insecurity: No Food Insecurity (07/22/2021)   Hunger Vital Sign    Worried About Running Out of Food in the Last Year: Never true    Ran Out of Food in the Last Year: Never true  Transportation Needs: No Transportation Needs (07/22/2021)   PRAPARE -  Administrator, Civil Service (Medical): No    Lack of Transportation (Non-Medical): No  Physical Activity: Insufficiently Active (07/22/2021)   Exercise Vital Sign    Days of Exercise per Week: 1 day    Minutes of Exercise per Session: 30 min  Stress: Stress Concern Present (07/22/2021)   [employer] of Occupational Health - Occupational Stress Questionnaire    Feeling of Stress : To some extent  Social Connections: Socially Integrated (07/22/2021)   Social Connection and Isolation Panel    Frequency of Communication with Friends and Family: More than three times a week    Frequency of Social Gatherings with Friends and Family: Once a week    Attends Religious Services: More than 4 times per year    Active Member of Clubs or Organizations: Yes    Attends Banker Meetings: More than 4 times per year    Marital Status: Married  Catering Manager Violence: Not on file  Depression (PHQ2-9): Low Risk (03/21/2024)   Depression (PHQ2-9)    PHQ-2 Score: 0  Alcohol Screen: Low Risk (07/22/2021)   Alcohol Screen    Last Alcohol Screening Score (AUDIT): 3  Housing: Low Risk (07/22/2021)   Housing    Last Housing Risk Score: 0  Utilities: Not on file  Health Literacy: Not on file    Outpatient Medications Prior to Visit  Medication Sig Dispense Refill   ascorbic acid (VITAMIN C) 500 MG tablet      atorvastatin  (LIPITOR) 20 MG tablet Take 1 tablet (20 mg total) by mouth daily. 90 tablet 3   Calcium  Carbonate-Vit D-Min (CALCIUM  1200 PO) Take by mouth daily.     Cholecalciferol (VITAMIN D3) 1000 units CAPS Take 1,000 Units by mouth daily.     Ferrous Sulfate (IRON) 325 (65 Fe) MG TABS Take 325 mg by mouth daily.      hydrochlorothiazide  (HYDRODIURIL ) 25 MG tablet Take 1 tablet (25 mg total) by mouth daily. 90 tablet 1   ibuprofen (ADVIL,MOTRIN) 200 MG tablet Take 200 mg by mouth every 6 (six) hours as needed for headache (Takes 2-4 tablets prn headache).      Potassium 99 MG TABS 1 tablet at bedtime.     No facility-administered medications prior to visit.    Allergies[1]  Review of Systems  Constitutional:  Negative for appetite change, chills, fatigue and fever.  HENT:  Positive for postnasal drip and rhinorrhea. Negative for congestion, dental problem, ear  pain and sore throat.   Eyes:  Negative for discharge, redness and visual disturbance.  Respiratory:  Positive for cough. Negative for chest tightness, shortness of breath and wheezing.   Cardiovascular:  Negative for chest pain, palpitations and leg swelling.  Gastrointestinal:  Negative for abdominal pain, blood in stool, diarrhea, nausea and vomiting.  Genitourinary:  Negative for difficulty urinating, dysuria, flank pain, frequency, hematuria and urgency.  Musculoskeletal:  Negative for arthralgias, back pain, joint swelling, myalgias and neck stiffness.  Skin:  Negative for pallor and rash.  Neurological:  Negative for dizziness, speech difficulty, weakness and headaches.  Hematological:  Negative for adenopathy. Does not bruise/bleed easily.  Psychiatric/Behavioral:  Negative for confusion and sleep disturbance. The patient is not nervous/anxious.     PE;    03/21/2024    8:02 AM 11/22/2023    1:35 PM 09/19/2023    8:12 AM  Vitals with BMI  Height 5' 2.75 5' 2 5' 3  Weight 205 lbs 202 lbs 207 lbs 10 oz  BMI 36.6 36.94 36.78  Systolic 111 128 870  Diastolic 72 82 86  Pulse 77 76 71   Gen: Alert, well appearing.  Patient is oriented to person, place, time, and situation. AFFECT: pleasant, lucid thought and speech. ENT: Ears: EACs clear, normal epithelium.  TMs with good light reflex and landmarks bilaterally.  Eyes: no injection, icteris, swelling, or exudate.  EOMI, PERRLA. Nose: no drainage or turbinate edema/swelling.  No injection or focal lesion.  Mouth: lips without lesion/swelling.  Oral mucosa pink and moist.  Dentition intact and without obvious caries or gingival  swelling.  Oropharynx without erythema, exudate, or swelling.  Neck: supple/nontender.  No LAD, mass, or TM.  Carotid pulses 2+ bilaterally, without bruits. CV: RRR, no m/r/g.   LUNGS: CTA bilat, nonlabored resps, good aeration in all lung fields. ABD: soft, NT, ND, BS normal.  No hepatospenomegaly or mass.  No bruits. EXT: no clubbing, cyanosis, or edema.  Musculoskeletal: no joint swelling, erythema, warmth, or tenderness.  ROM of all joints intact. Skin - no sores or suspicious lesions or rashes or color changes  Pertinent labs:  Lab Results  Component Value Date   TSH 0.89 03/21/2023   Lab Results  Component Value Date   WBC 5.2 03/21/2023   HGB 13.4 03/21/2023   HCT 41.6 03/21/2023   MCV 90.3 03/21/2023   PLT 259.0 03/21/2023   Lab Results  Component Value Date   CREATININE 1.03 09/19/2023   BUN 17 09/19/2023   NA 140 09/19/2023   K 4.6 09/19/2023   CL 103 09/19/2023   CO2 30 09/19/2023   Lab Results  Component Value Date   ALT 17 12/19/2023   AST 17 12/19/2023   ALKPHOS 39 03/21/2023   BILITOT 0.5 03/21/2023   Lab Results  Component Value Date   CHOL 135 12/19/2023   Lab Results  Component Value Date   HDL 63.30 12/19/2023   Lab Results  Component Value Date   LDLCALC 55 12/19/2023   Lab Results  Component Value Date   TRIG 79.0 12/19/2023   Lab Results  Component Value Date   CHOLHDL 2 12/19/2023   Lab Results  Component Value Date   HGBA1C 5.8 03/21/2023   ASSESSMENT AND PLAN:   1 health maintenance exam: Reviewed age and gender appropriate health maintenance issues (prudent diet, regular exercise, health risks of tobacco and excessive alcohol, use of seatbelts, fire alarms in home, use of sunscreen).  Also reviewed age  and gender appropriate health screening as well as vaccine recommendations. Vaccines: Prevnar 20-->deferred.  Otherwise ALL UTD Labs: fasting HP Cervical ca screening: UTD 01/10/24-->rpt 1 yr. Breast ca screening: UTD  01/2024-->due 01/2025 Colon ca screening: recall 2029  #2 hypertension, well-controlled on HCTZ 25 mg a day. Electrolytes and creatinine monitoring today.  3.  Hypercholesterolemia, doing great on atorvastatin  20 mg daily. Her LDL was 55 approximately 3 months ago. Next lipid panel 6 months. Hepatic panel today.  4.  IFG/prediabetes. Her hemoglobin A1c was 5.8% 1 year ago. Fasting glucose and hemoglobin A1c today.   #5 URI with cough. Suspect viral etiology. Resolving.  An After Visit Summary was printed and given to the patient.  FOLLOW UP:  No follow-ups on file.  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

## 2024-03-22 ENCOUNTER — Ambulatory Visit: Payer: Self-pay | Admitting: Family Medicine

## 2024-03-22 DIAGNOSIS — D709 Neutropenia, unspecified: Secondary | ICD-10-CM

## 2024-03-22 LAB — HEMOGLOBIN A1C
Hgb A1c MFr Bld: 5.9 % — ABNORMAL HIGH (ref <5.7)
Mean Plasma Glucose: 123 mg/dL
eAG (mmol/L): 6.8 mmol/L

## 2024-03-22 LAB — CBC WITH DIFFERENTIAL/PLATELET
Absolute Lymphocytes: 1235 {cells}/uL (ref 850–3900)
Absolute Monocytes: 410 {cells}/uL (ref 200–950)
Basophils Absolute: 29 {cells}/uL (ref 0–200)
Basophils Relative: 0.9 %
Eosinophils Absolute: 118 {cells}/uL (ref 15–500)
Eosinophils Relative: 3.7 %
HCT: 40.1 % (ref 35.9–46.0)
Hemoglobin: 13.4 g/dL (ref 11.7–15.5)
MCH: 29.5 pg (ref 27.0–33.0)
MCHC: 33.4 g/dL (ref 31.6–35.4)
MCV: 88.1 fL (ref 81.4–101.7)
MPV: 11.4 fL (ref 7.5–12.5)
Monocytes Relative: 12.8 %
Neutro Abs: 1408 {cells}/uL — ABNORMAL LOW (ref 1500–7800)
Neutrophils Relative %: 44 %
Platelets: 219 Thousand/uL (ref 140–400)
RBC: 4.55 Million/uL (ref 3.80–5.10)
RDW: 13.2 % (ref 11.0–15.0)
Total Lymphocyte: 38.6 %
WBC: 3.2 Thousand/uL — ABNORMAL LOW (ref 3.8–10.8)

## 2024-03-22 LAB — COMPREHENSIVE METABOLIC PANEL WITH GFR
AG Ratio: 1.7 (calc) (ref 1.0–2.5)
ALT: 19 U/L (ref 6–29)
AST: 21 U/L (ref 10–35)
Albumin: 4.4 g/dL (ref 3.6–5.1)
Alkaline phosphatase (APISO): 41 U/L (ref 37–153)
BUN/Creatinine Ratio: 18 (calc) (ref 6–22)
BUN: 19 mg/dL (ref 7–25)
CO2: 32 mmol/L (ref 20–32)
Calcium: 9.3 mg/dL (ref 8.6–10.4)
Chloride: 101 mmol/L (ref 98–110)
Creat: 1.05 mg/dL — ABNORMAL HIGH (ref 0.50–1.03)
Globulin: 2.6 g/dL (ref 1.9–3.7)
Glucose, Bld: 93 mg/dL (ref 65–99)
Potassium: 4.1 mmol/L (ref 3.5–5.3)
Sodium: 142 mmol/L (ref 135–146)
Total Bilirubin: 0.4 mg/dL (ref 0.2–1.2)
Total Protein: 7 g/dL (ref 6.1–8.1)
eGFR: 62 mL/min/1.73m2 (ref >=60)

## 2024-03-22 LAB — TSH: TSH: 0.82 m[IU]/L (ref 0.40–4.50)

## 2024-09-23 ENCOUNTER — Ambulatory Visit: Admitting: Family Medicine

## 2024-11-25 ENCOUNTER — Ambulatory Visit: Admitting: Obstetrics and Gynecology
